# Patient Record
Sex: Male | Born: 1970 | Race: Black or African American | Hispanic: No | Marital: Married | Smoking: Former smoker
Health system: Southern US, Community
[De-identification: ages and names within clinical notes are randomized; demographics above are authoritative.]

## PROBLEM LIST (undated history)

## (undated) ENCOUNTER — Emergency Department (HOSPITAL_COMMUNITY): Discharge status: Against Medical Advice | Payer: Self-pay

## (undated) DIAGNOSIS — I10 Essential (primary) hypertension: Secondary | ICD-10-CM

## (undated) DIAGNOSIS — J349 Unspecified disorder of nose and nasal sinuses: Secondary | ICD-10-CM

## (undated) DIAGNOSIS — E785 Hyperlipidemia, unspecified: Secondary | ICD-10-CM

## (undated) DIAGNOSIS — M1990 Unspecified osteoarthritis, unspecified site: Secondary | ICD-10-CM

## (undated) DIAGNOSIS — E119 Type 2 diabetes mellitus without complications: Secondary | ICD-10-CM

## (undated) HISTORY — DX: Type 2 diabetes mellitus without complications: E11.9

## (undated) HISTORY — DX: Hyperlipidemia, unspecified: E78.5

## (undated) HISTORY — DX: Unspecified osteoarthritis, unspecified site: M19.90

---

## 2000-11-08 ENCOUNTER — Emergency Department (HOSPITAL_COMMUNITY): Admission: EM | Admit: 2000-11-08 | Discharge: 2000-11-08 | Payer: Self-pay | Admitting: *Deleted

## 2004-10-11 ENCOUNTER — Emergency Department (HOSPITAL_COMMUNITY): Admission: EM | Admit: 2004-10-11 | Discharge: 2004-10-11 | Payer: Self-pay | Admitting: Emergency Medicine

## 2005-11-20 ENCOUNTER — Emergency Department (HOSPITAL_COMMUNITY): Admission: EM | Admit: 2005-11-20 | Discharge: 2005-11-20 | Payer: Self-pay | Admitting: Family Medicine

## 2007-01-29 ENCOUNTER — Emergency Department (HOSPITAL_COMMUNITY): Admission: EM | Admit: 2007-01-29 | Discharge: 2007-01-29 | Payer: Self-pay | Admitting: Emergency Medicine

## 2007-02-03 ENCOUNTER — Emergency Department (HOSPITAL_COMMUNITY): Admission: EM | Admit: 2007-02-03 | Discharge: 2007-02-03 | Payer: Self-pay | Admitting: Family Medicine

## 2010-01-14 ENCOUNTER — Emergency Department (HOSPITAL_COMMUNITY): Admission: EM | Admit: 2010-01-14 | Discharge: 2010-01-14 | Payer: Self-pay | Admitting: Emergency Medicine

## 2011-10-14 ENCOUNTER — Emergency Department (HOSPITAL_COMMUNITY)
Admission: EM | Admit: 2011-10-14 | Discharge: 2011-10-14 | Disposition: A | Discharge status: Home / Self Care | Payer: Self-pay | Attending: Emergency Medicine | Admitting: Emergency Medicine

## 2011-10-14 ENCOUNTER — Emergency Department (HOSPITAL_COMMUNITY): Payer: Self-pay

## 2011-10-14 DIAGNOSIS — S0510XA Contusion of eyeball and orbital tissues, unspecified eye, initial encounter: Secondary | ICD-10-CM | POA: Insufficient documentation

## 2011-10-14 DIAGNOSIS — H5789 Other specified disorders of eye and adnexa: Secondary | ICD-10-CM | POA: Insufficient documentation

## 2011-10-14 DIAGNOSIS — S0590XA Unspecified injury of unspecified eye and orbit, initial encounter: Secondary | ICD-10-CM

## 2011-10-14 DIAGNOSIS — I10 Essential (primary) hypertension: Secondary | ICD-10-CM | POA: Insufficient documentation

## 2011-10-14 DIAGNOSIS — H1130 Conjunctival hemorrhage, unspecified eye: Secondary | ICD-10-CM | POA: Insufficient documentation

## 2011-10-14 DIAGNOSIS — IMO0002 Reserved for concepts with insufficient information to code with codable children: Secondary | ICD-10-CM | POA: Insufficient documentation

## 2011-10-14 HISTORY — DX: Essential (primary) hypertension: I10

## 2011-10-14 MED ORDER — FLUORESCEIN SODIUM 1 MG OP STRP
2.0000 | ORAL_STRIP | Freq: Once | OPHTHALMIC | Status: DC
  Filled 2011-10-14: qty 1

## 2011-10-14 MED ORDER — TETRACAINE HCL 0.5 % OP SOLN
2.0000 [drp] | Freq: Once | OPHTHALMIC | Status: AC
  Administered 2011-10-14: 2 [drp] via OPHTHALMIC
  Filled 2011-10-14: qty 2

## 2011-10-14 MED ORDER — FLUORESCEIN SODIUM 1 MG OP STRP
1.0000 | ORAL_STRIP | Freq: Once | OPHTHALMIC | Status: AC
  Administered 2011-10-14: 09:00:00 via OPHTHALMIC

## 2011-10-14 MED ORDER — LOTEPREDNOL-TOBRAMYCIN 0.5-0.3 % OP SUSP: 2.0000 [drp] | Freq: Three times a day (TID) | OPHTHALMIC | Status: DC
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
## 2012-04-25 ENCOUNTER — Encounter (HOSPITAL_COMMUNITY): Payer: Self-pay | Admitting: *Deleted

## 2012-04-25 ENCOUNTER — Emergency Department (HOSPITAL_COMMUNITY)
Admission: EM | Admit: 2012-04-25 | Discharge: 2012-04-25 | Disposition: A | Discharge status: Home / Self Care | Payer: Self-pay | Attending: Emergency Medicine | Admitting: Emergency Medicine

## 2012-04-25 DIAGNOSIS — K0889 Other specified disorders of teeth and supporting structures: Secondary | ICD-10-CM

## 2012-04-25 DIAGNOSIS — K029 Dental caries, unspecified: Secondary | ICD-10-CM | POA: Insufficient documentation

## 2012-04-25 DIAGNOSIS — F17200 Nicotine dependence, unspecified, uncomplicated: Secondary | ICD-10-CM | POA: Insufficient documentation

## 2012-04-25 DIAGNOSIS — K056 Periodontal disease, unspecified: Secondary | ICD-10-CM | POA: Insufficient documentation

## 2012-04-25 MED ORDER — AMOXICILLIN 500 MG PO CAPS
500.0000 mg | ORAL_CAPSULE | Freq: Once | ORAL | Status: AC
  Administered 2012-04-25: 500 mg via ORAL
  Filled 2012-04-25: qty 1

## 2012-04-25 MED ORDER — AMOXICILLIN 500 MG PO CAPS: 500.0000 mg | ORAL_CAPSULE | Freq: Three times a day (TID) | ORAL | Status: AC

## 2012-04-25 MED ORDER — OXYCODONE-ACETAMINOPHEN 5-325 MG PO TABS: 1.0000 | ORAL_TABLET | Freq: Four times a day (QID) | ORAL | Status: AC | PRN

## 2012-04-25 MED ORDER — OXYCODONE-ACETAMINOPHEN 5-325 MG PO TABS
1.0000 | ORAL_TABLET | Freq: Once | ORAL | Status: AC
  Administered 2012-04-25: 1 via ORAL
  Filled 2012-04-25: qty 1

## 2012-04-25 NOTE — ED Provider Notes (Signed)
History     CSN: [id number]  Arrival date & time 04/25/12  1343   First MD Initiated Contact with Patient 04/25/12 1442      Chief Complaint  Patient presents with  . Dental Pain  . Oral Swelling    (Consider location/radiation/quality/duration/timing/severity/associated sxs/prior treatment) HPI Comments: 3 day h/o L mandibular jaw pain, swelling x 3 days. No neck swelling or trouble breathing. No fever. Palpation and chewing makes symptoms worse. No treatments prior. Nothing makes symptoms better. Onset gradual. Course constant.   Patient is a 41 y.o. male presenting with tooth pain. The history is provided by the patient.  Dental PainThe primary symptoms include headaches. Primary symptoms do not include dental injury, fever, shortness of breath or sore throat. The symptoms began 3 to 5 days ago. The symptoms are unchanged. The symptoms are new. The symptoms occur constantly.  Additional symptoms include: gum swelling, gum tenderness, facial swelling and ear pain. Additional symptoms do not include: trismus and trouble swallowing.    Past Medical History  Diagnosis Date  . Hypertension     History reviewed. No pertinent past surgical history.  No family history on file.  History  Substance Use Topics  . Smoking status: Current Everyday Smoker  . Smokeless tobacco: Not on file  . Alcohol Use: No      Review of Systems  Constitutional: Negative for fever.  HENT: Positive for ear pain, facial swelling and dental problem. Negative for sore throat, trouble swallowing and neck pain.   Respiratory: Negative for shortness of breath and stridor.   Skin: Negative for color change.  Neurological: Positive for headaches.    Allergies  Penicillins  Home Medications  No current outpatient prescriptions on file.  BP 168/85  Pulse 92  Temp 99.4 F (37.4 C) (Oral)  Resp 17  SpO2 98%  Physical Exam  Nursing note and vitals reviewed. Constitutional: He appears  well-developed and well-nourished.  HENT:  Head: Normocephalic and atraumatic. No trismus in the jaw.  Right Ear: Tympanic membrane, external ear and ear canal normal.  Left Ear: Tympanic membrane, external ear and ear canal normal.  Nose: Nose normal.  Mouth/Throat: Uvula is midline, oropharynx is clear and moist and mucous membranes are normal. Abnormal dentition. Dental caries present. No dental abscesses or uvula swelling. No tonsillar abscesses.         Mild facial swelling noted L mandibular jaw in area of 2nd premolar. Advanced periodontal disease.   Eyes: Pupils are equal, round, and reactive to light.  Neck: Normal range of motion. Neck supple.       No neck swelling or Lugwig's angina  Neurological: He is alert.  Skin: Skin is warm and dry.  Psychiatric: He has a normal mood and affect.    ED Course  Procedures (including critical care time)  Labs Reviewed - No data to display No results found.   1. Pain, dental     2:50 PM Patient seen and examined. Medications ordered.   Vital signs reviewed and are as follows: Filed Vitals:   04/25/12 1353  BP: 168/85  Pulse: 92  Temp: 99.4 F (37.4 C)  Resp: 17   Patient counseled to take prescribed medications as directed, return with worsening facial or neck swelling, and to follow-up with his dentist as soon as possible.   Patient counseled on use of narcotic pain medications. Counseled not to combine these medications with others containing tylenol. Urged not to drink alcohol, drive, or perform any other activities  that requires focus while taking these medications. The patient verbalizes understanding and agrees with the plan.    MDM  Patient with toothache.  Small forming dental abscess without systemic symptoms.  Exam unconcerning for Ludwig's angina or other deep tissue infection in neck.  Will treat with amoxicillin (penicillin allergy but has had amox in past without problem) and pain medicine.  Urged patient to  follow-up with dentist. Referrals given.          Renne Crigler, [place] 04/25/12 2220

## 2012-04-25 NOTE — ED Notes (Signed)
Pt reports dental pain and swelling x3 days on lower left side. Pt denies broken teeth. Pt reports history of similar issues with teeth on right side of mouth. Pt denies having a dentist. Pt reports pain radiates into jaw and face. Swelling noted.

## 2012-04-25 NOTE — ED Notes (Signed)
Pt denies any rash or difficulty breathing after taking Amoxicillin. Pt with no acute distress.

## 2012-04-25 NOTE — ED Notes (Signed)
PA aware of PCN allergy and states the patient has had amoxicillin before without issue.  PA prescribing amoxicillin to give pt in ER.

## 2012-04-26 NOTE — ED Provider Notes (Signed)
Medical screening examination/treatment/procedure(s) were performed by non-physician practitioner and as supervising physician I was immediately available for consultation/collaboration.   Lyanne Co, MD 04/26/12 2140

## 2013-01-01 ENCOUNTER — Encounter (HOSPITAL_COMMUNITY): Payer: Self-pay | Admitting: Emergency Medicine

## 2013-01-01 ENCOUNTER — Emergency Department (HOSPITAL_COMMUNITY)
Admission: EM | Admit: 2013-01-01 | Discharge: 2013-01-01 | Disposition: A | Discharge status: Home / Self Care | Payer: Self-pay | Attending: Emergency Medicine | Admitting: Emergency Medicine

## 2013-01-01 DIAGNOSIS — IMO0002 Reserved for concepts with insufficient information to code with codable children: Secondary | ICD-10-CM | POA: Insufficient documentation

## 2013-01-01 DIAGNOSIS — F17200 Nicotine dependence, unspecified, uncomplicated: Secondary | ICD-10-CM | POA: Insufficient documentation

## 2013-01-01 DIAGNOSIS — I10 Essential (primary) hypertension: Secondary | ICD-10-CM | POA: Insufficient documentation

## 2013-01-01 DIAGNOSIS — L0291 Cutaneous abscess, unspecified: Secondary | ICD-10-CM

## 2013-01-01 MED ORDER — TRAMADOL HCL 50 MG PO TABS: 50.0000 mg | ORAL_TABLET | Freq: Four times a day (QID) | ORAL | Status: DC | PRN

## 2013-01-01 MED ORDER — TRAMADOL HCL 50 MG PO TABS
50.0000 mg | ORAL_TABLET | Freq: Once | ORAL | Status: AC
  Administered 2013-01-01: 50 mg via ORAL
  Filled 2013-01-01: qty 1

## 2013-01-01 MED ORDER — LORAZEPAM 2 MG/ML IJ SOLN
1.0000 mg | Freq: Once | INTRAMUSCULAR | Status: AC
  Administered 2013-01-01: 1 mg via INTRAMUSCULAR
  Filled 2013-01-01: qty 1

## 2013-01-01 NOTE — ED Notes (Signed)
Onset 2 days ago abscess under right arm and increasing in size overtime. Pain 10/10 sharp throbbing.

## 2013-01-01 NOTE — ED Provider Notes (Signed)
History     CSN: [id number]  Arrival date & time 01/01/13  2130   First MD Initiated Contact with Patient 01/01/13 1054      Chief Complaint  Patient presents with  . Abscess    (Consider location/radiation/quality/duration/timing/severity/associated sxs/prior treatment) Patient is a 42 y.o. male presenting with abscess. The history is provided by the patient. No language interpreter was used.  Abscess Location:  Shoulder/arm Shoulder/arm abscess location:  R axilla Size:  2cm Abscess quality: fluctuance, induration and painful   Abscess quality: not draining, no itching, no redness and not weeping   Red streaking: no   Duration:  2 days Progression:  Worsening Pain details:    Quality:  Sharp and throbbing   Severity:  Severe   Duration:  2 days   Timing:  Constant   Progression:  Worsening Chronicity:  New Context: not diabetes, not immunosuppression and not skin injury   Relieved by:  Nothing Worsened by:  Nothing tried Ineffective treatments:  None tried Associated symptoms: no anorexia, no fatigue, no fever, no headaches, no nausea and no vomiting   Risk factors: prior abscess     Past Medical History  Diagnosis Date  . Hypertension     History reviewed. No pertinent past surgical history.  No family history on file.  History  Substance Use Topics  . Smoking status: Current Every Day Smoker  . Smokeless tobacco: Not on file  . Alcohol Use: No      Review of Systems  Constitutional: Negative for fever and fatigue.  Gastrointestinal: Negative for nausea, vomiting and anorexia.  Neurological: Negative for headaches.  All other systems reviewed and are negative.    Allergies  Penicillins  Home Medications   Current Outpatient Rx  Name  Route  Sig  Dispense  Refill  . traMADol (ULTRAM) 50 MG tablet   Oral   Take 1 tablet (50 mg total) by mouth every 6 (six) hours as needed for pain.   15 tablet   0     BP 153/99  Pulse 88  Temp(Src)  98.4 F (36.9 C) (Oral)  Resp 20  Ht 5\' 8"  (1.727 m)  Wt 185 lb (83.915 kg)  BMI 28.14 kg/m2  SpO2 97%  Physical Exam  Constitutional: He is oriented to person, place, and time. He appears well-developed and well-nourished. No distress.  HENT:  Head: Normocephalic and atraumatic.  Right Ear: External ear normal.  Left Ear: External ear normal.  Nose: Nose normal.  Eyes: Conjunctivae are normal.  Neck: Normal range of motion. No tracheal deviation present.  Cardiovascular: Normal rate, regular rhythm, normal heart sounds and intact distal pulses.  Exam reveals no gallop and no friction rub.   No murmur heard. Pulmonary/Chest: Effort normal and breath sounds normal. No stridor. No respiratory distress. He has no wheezes. He has no rales.  Abdominal: Soft. He exhibits no distension.  Musculoskeletal: Normal range of motion.  Neurological: He is alert and oriented to person, place, and time.  Skin: Skin is warm and dry. He is not diaphoretic.  2 cm abscess on right armpit: fluctuant, indurated  No streaking or erythema  Small abscess that appeared to have drained below 2cm abscess - healing well, no fluctuance or induration remain  Psychiatric: He has a normal mood and affect. His behavior is normal.    ED Course  Procedures (including critical care time)  Labs Reviewed - No data to display No results found.  INCISION AND DRAINAGE Performed by: Konrad Dolores,  Dahlia Client Consent: Verbal consent obtained. Risks and benefits: risks, benefits and alternatives were discussed Type: abscess  Body area: right armpit  Anesthesia: local infiltration  Incision was made with a scalpel.  Local anesthetic: lidocaine 2%   Anesthetic total: 3 ml  Complexity: complex Blunt dissection to break up loculations  Drainage: purulent  Drainage amount: mild  Packing material: 1/4 in iodoform gauze  Patient tolerance: Patient tolerated the procedure well with no immediate  complications.    1. Abscess       MDM  Patient presented today with abscess. Opened and packed to prevent closure of the opening. Follow up with PCP in 2 days. Hemodynamically stable. Return precautions given including return if you develop a fever, notice redness and streaking around the area, or a significant increase in pain. Home care instructions given. Patient / Family / Caregiver informed of clinical course, understand medical decision-making process, and agree with plan.        Mora Bellman, PA-C 01/01/13 1720

## 2013-01-01 NOTE — ED Notes (Signed)
Pt has abcess under right arm that started 2 days ago.  Pain 10/10 Pt alert oriented X4

## 2013-01-01 NOTE — Discharge Instructions (Signed)
Abscess An abscess is an infected area that contains a collection of pus and debris. It can occur in almost any part of the body. An abscess is also known as a furuncle or boil. CAUSES   An abscess occurs when tissue gets infected. This can occur from blockage of oil or sweat glands, infection of hair follicles, or a minor injury to the skin. As the body tries to fight the infection, pus collects in the area and creates pressure under the skin. This pressure causes pain. People with weakened immune systems have difficulty fighting infections and get certain abscesses more often.   SYMPTOMS Usually an abscess develops on the skin and becomes a painful mass that is red, warm, and tender. If the abscess forms under the skin, you may feel a moveable soft area under the skin. Some abscesses break open (rupture) on their own, but most will continue to get worse without care. The infection can spread deeper into the body and eventually into the bloodstream, causing you to feel ill.   DIAGNOSIS   Your caregiver will take your medical history and perform a physical exam. A sample of fluid may also be taken from the abscess to determine what is causing your infection. TREATMENT   Your caregiver may prescribe antibiotic medicines to fight the infection. However, taking antibiotics alone usually does not cure an abscess. Your caregiver may need to make a small cut (incision) in the abscess to drain the pus. In some cases, gauze is packed into the abscess to reduce pain and to continue draining the area. HOME CARE INSTRUCTIONS    Only take over-the-counter or prescription medicines for pain, discomfort, or fever as directed by your caregiver.   If you were prescribed antibiotics, take them as directed. Finish them even if you start to feel better.   If gauze is used, follow your caregiver's directions for changing the gauze.   To avoid spreading the infection:   Keep your draining abscess covered with a  bandage.   Wash your hands well.   Do not share personal care items, towels, or whirlpools with others.   Avoid skin contact with others.   Keep your skin and clothes clean around the abscess.   Keep all follow-up appointments as directed by your caregiver.  SEEK MEDICAL CARE IF:    You have increased pain, swelling, redness, fluid drainage, or bleeding.   You have muscle aches, chills, or a general ill feeling.   You have a fever.  MAKE SURE YOU:    Understand these instructions.   Will watch your condition.   Will get help right away if you are not doing well or get worse.  Document Released: 07/07/2005 Document Revised: 03/28/2012 Document Reviewed: 12/10/2011 [employer] Patient Information 2013 [employer].    

## 2013-01-02 NOTE — ED Provider Notes (Signed)
Medical screening examination/treatment/procedure(s) were performed by non-physician practitioner and as supervising physician I was immediately available for consultation/collaboration.   Johnesha Acheampong E Kit Mollett, MD 01/02/13 1551 

## 2013-12-13 ENCOUNTER — Emergency Department (HOSPITAL_COMMUNITY): Payer: Self-pay

## 2013-12-13 ENCOUNTER — Emergency Department (HOSPITAL_COMMUNITY)
Admission: EM | Admit: 2013-12-13 | Discharge: 2013-12-13 | Disposition: A | Discharge status: Home / Self Care | Payer: Self-pay | Attending: Emergency Medicine | Admitting: Emergency Medicine

## 2013-12-13 ENCOUNTER — Encounter (HOSPITAL_COMMUNITY): Payer: Self-pay | Admitting: Emergency Medicine

## 2013-12-13 DIAGNOSIS — F17200 Nicotine dependence, unspecified, uncomplicated: Secondary | ICD-10-CM | POA: Insufficient documentation

## 2013-12-13 DIAGNOSIS — M545 Low back pain, unspecified: Secondary | ICD-10-CM | POA: Insufficient documentation

## 2013-12-13 DIAGNOSIS — I10 Essential (primary) hypertension: Secondary | ICD-10-CM | POA: Insufficient documentation

## 2013-12-13 DIAGNOSIS — M549 Dorsalgia, unspecified: Secondary | ICD-10-CM

## 2013-12-13 DIAGNOSIS — G8921 Chronic pain due to trauma: Secondary | ICD-10-CM | POA: Insufficient documentation

## 2013-12-13 DIAGNOSIS — Z880 Allergy status to penicillin: Secondary | ICD-10-CM | POA: Insufficient documentation

## 2013-12-13 MED ORDER — HYDROCODONE-ACETAMINOPHEN 5-325 MG PO TABS: 2.0000 | ORAL_TABLET | Freq: Four times a day (QID) | ORAL | Status: DC | PRN

## 2013-12-13 MED ORDER — KETOROLAC TROMETHAMINE 60 MG/2ML IM SOLN
60.0000 mg | Freq: Once | INTRAMUSCULAR | Status: DC
  Filled 2013-12-13: qty 2

## 2013-12-13 NOTE — ED Notes (Signed)
Per pt, spine pain radiating to left.  No difficulty urinating.  No trauma or injury.  No new exercise or activity.
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
## 2013-12-13 NOTE — Discharge Instructions (Signed)
Back Pain, Adult Low back pain is very common. About 1 in 5 people have back pain.The cause of low back pain is rarely dangerous. The pain often gets better over time.About half of people with a sudden onset of back pain feel better in just 2 weeks. About 8 in 10 people feel better by 6 weeks.  CAUSES Some common causes of back pain include:  Strain of the muscles or ligaments supporting the spine.  Wear and tear (degeneration) of the spinal discs.  Arthritis.  Direct injury to the back. DIAGNOSIS Most of the time, the direct cause of low back pain is not known.However, back pain can be treated effectively even when the exact cause of the pain is unknown.Answering your caregiver's questions about your overall health and symptoms is one of the most accurate ways to make sure the cause of your pain is not dangerous. If your caregiver needs more information, he or she may order lab work or imaging tests (X-rays or MRIs).However, even if imaging tests show changes in your back, this usually does not require surgery. HOME CARE INSTRUCTIONS For many people, back pain returns.Since low back pain is rarely dangerous, it is often a condition that people can learn to [facility] their own.   Remain active. It is stressful on the back to sit or stand in one place. Do not sit, drive, or stand in one place for more than 30 minutes at a time. Take short walks on level surfaces as soon as pain allows.Try to increase the length of time you walk each day.  Do not stay in bed.Resting more than 1 or 2 days can delay your recovery.  Do not avoid exercise or work.Your body is made to move.It is not dangerous to be active, even though your back may hurt.Your back will likely heal faster if you return to being active before your pain is gone.  Pay attention to your body when you bend and lift. Many people have less discomfortwhen lifting if they bend their knees, keep the load close to their bodies,and  avoid twisting. Often, the most comfortable positions are those that put less stress on your recovering back.  Find a comfortable position to sleep. Use a firm mattress and lie on your side with your knees slightly bent. If you lie on your back, put a pillow under your knees.  Only take over-the-counter or prescription medicines as directed by your caregiver. Over-the-counter medicines to reduce pain and inflammation are often the most helpful.Your caregiver may prescribe muscle relaxant drugs.These medicines help dull your pain so you can more quickly return to your normal activities and healthy exercise.  Put ice on the injured area.  Put ice in a plastic bag.  Place a towel between your skin and the bag.  Leave the ice on for 15-20 minutes, 03-04 times a day for the first 2 to 3 days. After that, ice and heat may be alternated to reduce pain and spasms.  Ask your caregiver about trying back exercises and gentle massage. This may be of some benefit.  Avoid feeling anxious or stressed.Stress increases muscle tension and can worsen back pain.It is important to recognize when you are anxious or stressed and learn ways to manage it.Exercise is a great option. SEEK MEDICAL CARE IF:  You have pain that is not relieved with rest or medicine.  You have pain that does not improve in 1 week.  You have new symptoms.  You are generally not feeling well. SEEK  IMMEDIATE MEDICAL CARE IF:   You have pain that radiates from your back into your legs.  You develop new bowel or bladder control problems.  You have unusual weakness or numbness in your arms or legs.  You develop nausea or vomiting.  You develop abdominal pain.  You feel faint. Document Released: 09/27/2005 Document Revised: 03/28/2012 Document Reviewed: 02/15/2011 [facility] Patient Information 2014 [place], [place].   Hydrocodone for mod-severe pain Establish and follow-up with PCP

## 2013-12-13 NOTE — Progress Notes (Signed)
[facility] provided pt with a list of primary care resources and a [facility] Orange Card application to help patient establish primary care.  °

## 2013-12-20 NOTE — ED Provider Notes (Signed)
Medical screening examination/treatment/procedure(s) were performed by non-physician practitioner and as supervising physician I was immediately available for consultation/collaboration.   EKG Interpretation None      Rolland Porter, MD, Abram Sander   Janice Norrie, MD 12/20/13 [phone]

## 2014-01-30 ENCOUNTER — Ambulatory Visit: Payer: Self-pay

## 2014-06-11 ENCOUNTER — Ambulatory Visit: Payer: Self-pay | Admitting: Internal Medicine

## 2014-06-27 ENCOUNTER — Ambulatory Visit: Payer: Self-pay | Admitting: Family Medicine

## 2014-07-21 ENCOUNTER — Emergency Department (HOSPITAL_COMMUNITY)
Admission: EM | Admit: 2014-07-21 | Discharge: 2014-07-22 | Disposition: A | Discharge status: Home / Self Care | Payer: Self-pay | Attending: Emergency Medicine | Admitting: Emergency Medicine

## 2014-07-21 DIAGNOSIS — I10 Essential (primary) hypertension: Secondary | ICD-10-CM | POA: Insufficient documentation

## 2014-07-21 DIAGNOSIS — M545 Low back pain, unspecified: Secondary | ICD-10-CM

## 2014-07-21 DIAGNOSIS — Y92410 Unspecified street and highway as the place of occurrence of the external cause: Secondary | ICD-10-CM | POA: Insufficient documentation

## 2014-07-21 DIAGNOSIS — Y9389 Activity, other specified: Secondary | ICD-10-CM | POA: Insufficient documentation

## 2014-07-21 DIAGNOSIS — Z8709 Personal history of other diseases of the respiratory system: Secondary | ICD-10-CM | POA: Insufficient documentation

## 2014-07-21 DIAGNOSIS — S161XXA Strain of muscle, fascia and tendon at neck level, initial encounter: Secondary | ICD-10-CM

## 2014-07-21 DIAGNOSIS — S139XXA Sprain of joints and ligaments of unspecified parts of neck, initial encounter: Secondary | ICD-10-CM | POA: Insufficient documentation

## 2014-07-21 DIAGNOSIS — S3982XA Other specified injuries of lower back, initial encounter: Secondary | ICD-10-CM | POA: Insufficient documentation

## 2014-07-21 DIAGNOSIS — Z880 Allergy status to penicillin: Secondary | ICD-10-CM | POA: Insufficient documentation

## 2014-07-21 HISTORY — DX: Unspecified disorder of nose and nasal sinuses: J34.9
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
## 2014-07-22 ENCOUNTER — Encounter (HOSPITAL_COMMUNITY): Payer: Self-pay | Admitting: Emergency Medicine

## 2014-07-22 ENCOUNTER — Emergency Department (HOSPITAL_COMMUNITY): Payer: Self-pay

## 2014-07-22 MED ORDER — NAPROXEN 500 MG PO TABS: 500.0000 mg | ORAL_TABLET | Freq: Two times a day (BID) | ORAL | Status: DC

## 2014-07-22 MED ORDER — NAPROXEN 500 MG PO TABS
500.0000 mg | ORAL_TABLET | Freq: Once | ORAL | Status: AC
Start: 2014-07-22 — End: 2014-07-22
  Administered 2014-07-22: 500 mg via ORAL
  Filled 2014-07-22: qty 1

## 2014-07-22 MED ORDER — DIAZEPAM 5 MG PO TABS
5.0000 mg | ORAL_TABLET | Freq: Once | ORAL | Status: AC
  Administered 2014-07-22: 5 mg via ORAL
  Filled 2014-07-22: qty 1

## 2014-07-22 MED ORDER — DIAZEPAM 5 MG PO TABS: 5.0000 mg | ORAL_TABLET | Freq: Two times a day (BID) | ORAL | Status: DC

## 2014-07-22 MED ORDER — OXYCODONE-ACETAMINOPHEN 5-325 MG PO TABS
2.0000 | ORAL_TABLET | Freq: Once | ORAL | Status: AC
  Administered 2014-07-22: 2 via ORAL
  Filled 2014-07-22: qty 2

## 2014-07-22 MED ORDER — HYDROCODONE-ACETAMINOPHEN 5-325 MG PO TABS: 1.0000 | ORAL_TABLET | Freq: Four times a day (QID) | ORAL | Status: DC | PRN
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
## 2014-07-22 NOTE — ED Provider Notes (Signed)
Medical screening examination/treatment/procedure(s) were performed by non-physician practitioner and as supervising physician I was immediately available for consultation/collaboration.   Leota Jacobsen, MD 07/22/14 [phone]

## 2014-10-25 ENCOUNTER — Institutional Professional Consult (permissible substitution): Payer: Self-pay | Admitting: Sports Medicine

## 2014-10-30 ENCOUNTER — Ambulatory Visit (INDEPENDENT_AMBULATORY_CARE_PROVIDER_SITE_OTHER): Payer: Self-pay

## 2014-10-30 ENCOUNTER — Encounter: Payer: Self-pay | Admitting: Sports Medicine

## 2014-10-30 ENCOUNTER — Ambulatory Visit (INDEPENDENT_AMBULATORY_CARE_PROVIDER_SITE_OTHER): Payer: Self-pay | Admitting: Sports Medicine

## 2014-10-30 ENCOUNTER — Telehealth: Payer: Self-pay

## 2014-10-30 VITALS — BP 172/107 | HR 93 | Ht 68.0 in | Wt 219.0 lb

## 2014-10-30 DIAGNOSIS — M545 Low back pain, unspecified: Secondary | ICD-10-CM

## 2014-10-30 DIAGNOSIS — M5136 Other intervertebral disc degeneration, lumbar region: Secondary | ICD-10-CM | POA: Insufficient documentation

## 2014-10-30 DIAGNOSIS — M51369 Other intervertebral disc degeneration, lumbar region without mention of lumbar back pain or lower extremity pain: Secondary | ICD-10-CM | POA: Insufficient documentation

## 2014-10-30 MED ORDER — PREDNISONE 50 MG PO TABS: ORAL_TABLET | ORAL | Status: DC

## 2014-10-30 MED ORDER — MELOXICAM 15 MG PO TABS: ORAL_TABLET | ORAL | Status: DC

## 2014-10-30 MED ORDER — CYCLOBENZAPRINE HCL 10 MG PO TABS: ORAL_TABLET | ORAL | Status: DC

## 2014-10-30 NOTE — Telephone Encounter (Signed)
Patient was seen in office today for back pain. Patient did request a note to be out of work until November 05, 2014. Imaan Padgett,CMA

## 2014-10-30 NOTE — Assessment & Plan Note (Signed)
Pain is likely related to lumbar spondylosis. He does not have radiculopathy however pain is discogenic. Prednisone, meloxicam, Flexeril, x-rays, formal physical therapy.  Return in one month, MRI for interventional if no better.
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
## 2014-11-07 ENCOUNTER — Ambulatory Visit: Payer: No Typology Code available for payment source | Attending: Sports Medicine | Admitting: Physical Therapy

## 2014-11-07 DIAGNOSIS — M545 Low back pain, unspecified: Secondary | ICD-10-CM

## 2014-11-07 NOTE — Patient Instructions (Addendum)
Trigger Point Dry Needling  . What is Trigger Point Dry Needling (DN)? o DN is a physical therapy technique used to treat muscle pain and dysfunction. Specifically, DN helps deactivate muscle trigger points (muscle knots).  o A thin filiform needle is used to penetrate the skin and stimulate the underlying trigger point. The goal is for a local twitch response (LTR) to occur and for the trigger point to relax. No medication of any kind is injected during the procedure.   . What Does Trigger Point Dry Needling Feel Like?  o The procedure feels different for each individual patient. Some patients report that they do not actually feel the needle enter the skin and overall the process is not painful. Very mild bleeding may occur. However, many patients feel a deep cramping in the muscle in which the needle was inserted. This is the local twitch response.   Marland Kitchen How Will I feel after the treatment? o Soreness is normal, and the onset of soreness may not occur for a few hours. Typically this soreness does not last longer than two days.  o Bruising is uncommon, however; ice can be used to decrease any possible bruising.  o In rare cases feeling tired or nauseous after the treatment is normal. In addition, your symptoms may get worse before they get better, this period will typically not last longer than 24 hours.   . What Can I do After My Treatment? o Increase your hydration by drinking more water for the next 24 hours. o You may place ice or heat on the areas treated that have become sore, however, do not use heat on inflamed or bruised areas. Heat often brings more relief post needling. o You can continue your regular activities, but vigorous activity is not recommended initially after the treatment for 24 hours. o DN is best combined with other physical therapy such as strengthening, stretching, and other therapies.  Backward Bend (Standing)   Arch backward to make hollow of back deeper. Hold __2__  seconds. Repeat __10_ times per set. Do ___1_ sets per session. Every 2 hours.  http://orth.exer.us/178   Copyright  VHI. All rights reserved.  Press-Up   Press upper body upward, keeping hips in contact with floor. Keep lower back and buttocks relaxed. Hold __2__ seconds. Repeat __10__ times per set. Do _1__ sets per session. Every 2 hours.  http://orth.exer.us/94   Copyright  VHI. All rights reserved.
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
## 2014-11-12 ENCOUNTER — Ambulatory Visit: Payer: Self-pay | Attending: Sports Medicine | Admitting: Physical Therapy

## 2014-11-12 DIAGNOSIS — M545 Low back pain, unspecified: Secondary | ICD-10-CM

## 2014-11-12 NOTE — Therapy (Signed)
[name], [place], [address] Phone: [phone]   Fax:  [phone]  Physical Therapy Treatment  Patient Details  Name: Juan Hensley MRN: [id number] Date of Birth: 04-05-1971 Referring Provider:  Silverio Decamp,*  Encounter Date: 11/12/2014      PT End of Session - 11/12/14 1440    Visit Number 2   Number of Visits 16   Date for PT Re-Evaluation 01/02/15   PT Start Time 0845   PT Stop Time 0936   PT Time Calculation (min) 51 min   Activity Tolerance Patient tolerated treatment well      Past Medical History  Diagnosis Date  . Hypertension   . Sinus disease     No past surgical history on file.  There were no vitals taken for this visit.  Visit Diagnosis:  Left-sided low back pain without sciatica      Subjective Assessment - 11/12/14 0846    Symptoms Reports he has been doing press ups and can get up a little higher each time he does them.     Pain Score 7    Pain Location Back   Pain Orientation Left   Pain Type Chronic pain                    [facility] Adult PT Treatment/Exercise - 11/12/14 0925    Exercises   Exercises Lumbar   Lumbar Exercises: Supine   Ab Set 10 reps   Isometric Hip Flexion 10 reps   Lumbar Exercises: Prone   Other Prone Lumbar Exercises --  Prone press ups 10x   Other Prone Lumbar Exercises --  Prone press ups with exhalation 5x   Modalities   Modalities Moist Heat   Moist Heat Therapy   Number Minutes Moist Heat 12 Minutes   Moist Heat Location --  Lumbar   Manual Therapy   Manual Therapy Joint mobilization;Myofascial release   Joint Mobilization --  left neutral gapping grade 3 30 sec; left hip distract/inf   Myofascial Release --  eft lumbar multifid instrument assisted ischemic pressure          Trigger Point Dry Needling - 11/12/14 1435    Consent Given? 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   Time 4   Period Weeks   Status On-going           PT Long Term Goals - 11/12/14 1445    PT LONG TERM GOAL #1   Title "Demonstrate and verbalize techniques to reduce the risk of re-injury including: lifting, posture, body mechanics.    Time 8  Period Weeks   Status On-going   PT LONG TERM GOAL #2   Title "Pt will be independent with advanced HEP and appropriate gym program (patient plans on joining [employer])   Time 8   Period Weeks   Status On-going   PT LONG TERM GOAL #3   Title "FOTO will improve from  63%  to  37%   indicating improved functional mobility with less perceived pain   Time 8   Period Weeks   Status On-going   PT LONG TERM GOAL #4   Title Lumbar flexion improved to 40 degrees and left sidebending to 33 degrees needed for work as a Dealer   Time 8   Period Weeks   Status On-going   PT LONG TERM GOAL #5   Title Trunk flexor and extensor strength to 4+/5 needed for strength/stabilization with work duties and playing with the grandchildren.   Time 8   Period Weeks   Status On-going               Plan - 11/12/14 1441    Clinical Impression Statement The patient with improved muscle length and extensibility following ther ex and manual interventions.  No progress toward goals secondary to only 2 nd visit.  Therapist  closely monitoring pain intensity and location throughout treatment session.    PT Next Visit Plan Assess response to dry needling of left lumbar multifidi and lumbar/hip mobilization; progress core strengthening abdominal brace and possibly initiate quadruped bird dogs        Problem List Patient Active Problem List   Diagnosis Date Noted  . Low back pain 10/30/2014    Alvera Singh 11/12/2014, 2:47 PM  [facility] [address] [place], [place], [address] Phone: [phone]   Fax:  [phone]    Ruben Im, PT 11/12/2014 2:47 PM Phone: [phone] Fax: [phone]

## 2014-11-15 ENCOUNTER — Ambulatory Visit: Payer: Self-pay | Admitting: Physical Therapy

## 2014-11-15 DIAGNOSIS — M545 Low back pain, unspecified: Secondary | ICD-10-CM

## 2014-11-15 NOTE — Patient Instructions (Signed)
Isometric Hold (Quadruped)   On hands and knees, slowly inhale, and then exhale. Pull navel toward spine and Hold for _3_ seconds. Continue to breathe in and out during hold. Rest for ___ seconds. Repeat _10__ times. Do __1_ times a day.   Copyright  VHI. All rights reserved.  Bracing With Arm Raise (Quadruped)   On hands and knees find neutral spine. Tighten pelvic floor and abdominals and hold. Alternately lift arm to shoulder level. Repeat _10__ times. Do __1_ times a day.   Copyright  VHI. All rights reserved.  Bracing With Leg Raise (Quadruped)   On hands and knees find neutral spine. Tighten pelvic floor and abdominals and hold. Alternating legs, straighten and lift to hip level. Repeat ___ times. Do ___ times a day.   Copyright  VHI. All rights reserved.  Bracing With Arm / Leg Raise (Quadruped)   On hands and knees find neutral spine. Tighten pelvic floor and abdominals and hold. Alternating, lift arm to shoulder level and opposite leg to hip level. Repeat _10__ times. Do ___ times a day.   Copyright  VHI. All rights reserved.

## 2014-11-15 NOTE — Therapy (Signed)
[place], [place], [address] Phone: [phone]   Fax:  [phone]  Physical Therapy Treatment  Patient Details  Name: Juan Hensley MRN: [id number] Date of Birth: Jul 24, 1971 Referring Provider:  Silverio Decamp,*  Encounter Date: 11/15/2014      PT End of Session - 11/15/14 0900    Visit Number 3   Number of Visits 16   Date for PT Re-Evaluation 01/02/15   PT Start Time 0800   PT Stop Time 0900   PT Time Calculation (min) 60 min   Activity Tolerance Patient tolerated treatment well      Past Medical History  Diagnosis Date  . Hypertension   . Sinus disease     No past surgical history on file.  There were no vitals taken for this visit.  Visit Diagnosis:  Left-sided low back pain without sciatica      Subjective Assessment - 11/15/14 0757    Symptoms I can walk further now.  Some soreness and achiness.     How long can you walk comfortably? 2 blocks (better)   Currently in Pain? Yes   Pain Score 6    Pain Location Back   Pain Orientation Left   Pain Type Chronic pain   Pain Frequency Constant                    [facility] Adult PT Treatment/Exercise - 11/15/14 0849    Lumbar Exercises: Supine   Ab Set 10 reps   Dead Bug 10 reps   Isometric Hip Flexion 10 reps   Lumbar Exercises: Prone   Other Prone Lumbar Exercises --  Prone press ups 10x,with exhale, with belt 5x   Lumbar Exercises: Quadruped   Opposite Arm/Leg Raise 10 reps   Moist Heat Therapy   Number Minutes Moist Heat 12 Minutes   Moist Heat Location --  Lumbar supine   Manual Therapy   Joint Mobilization PA L1 to L5 grade 3 5x each level   Myofascial Release Soft tissue to left lumbar paraspinal          Trigger Point Dry Needling - 11/15/14 7341    Consent Given? 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   Time 4   Period Weeks   Status On-going           PT Long Term Goals - 11/15/14 [phone]  PT LONG TERM GOAL #1   Title "Demonstrate and verbalize techniques to reduce the risk of re-injury including: lifting, posture, body mechanics.    Time 8   Period Weeks   Status On-going   PT LONG TERM GOAL #2   Title "Pt will be independent with advanced HEP and appropriate gym program (patient plans on joining [employer])   Time 8   Period Weeks   Status On-going   PT LONG TERM GOAL #3   Title "FOTO will improve from  63%  to  37%   indicating improved functional mobility with less perceived pain   Time 8   Period Weeks   Status On-going   PT LONG TERM GOAL #4   Title Lumbar flexion improved to 40 degrees and left sidebending to 33 degrees needed for work as a Dealer   Time 8   Period Weeks   Status On-going   PT LONG TERM GOAL #5   Title Trunk flexor and extensor strength to 4+/5 needed for strength/stabilization with work duties and playing with the grandchildren.   Time 8   Period Weeks   Status On-going               Plan - 11/15/14 0900    Clinical Impression Statement Lumbar joint mobility  improving, improved walking ability and slight reduction in overall pain.  Patient does continue to have deep left lumbar multifidi pain and spasm.  Patient receptive to PT education and willing to pursue new interventions since previous chiro treatment had no benefit.  Progressing toward STGs.    PT Next Visit Plan Recheck lumbar AROM; Review and progress abdominal bracing series and quadruped bird dogs; prone press ups with overpressure;  assess response to dry needling; Possible left lumbar mobs and hip mobs        Problem List Patient Active Problem List   Diagnosis Date Noted  . Low back pain 10/30/2014    Alvera Singh 11/15/2014, 9:09 AM  [facility] [address] [place], [place], [address] Phone: [phone]   Fax:  [phone]    Ruben Im, PT 11/15/2014 9:09 AM Phone: [phone] Fax: [phone]

## 2014-11-19 ENCOUNTER — Ambulatory Visit: Payer: Self-pay | Admitting: Rehabilitation

## 2014-11-19 DIAGNOSIS — M545 Low back pain, unspecified: Secondary | ICD-10-CM

## 2014-11-19 NOTE — Therapy (Signed)
[place], [place], [address] Phone: [phone]   Fax:  [phone]  Physical Therapy Treatment  Patient Details  Name: Juan Hensley MRN: [id number] Date of Birth: 08-26-1971 Referring Provider:  Silverio Decamp,*  Encounter Date: 11/19/2014      PT End of Session - 11/19/14 0807    Visit Number 4   Number of Visits 16   Date for PT Re-Evaluation 01/02/15   PT Start Time 0805      Past Medical History  Diagnosis Date  . Hypertension   . Sinus disease     No past surgical history on file.  There were no vitals taken for this visit.  Visit Diagnosis:  Left-sided low back pain without sciatica      Subjective Assessment - 11/19/14 0806    Symptoms Left low back achy pain. 6/10   Aggravating Factors  walking   Pain Relieving Factors sitting, resting.           [facility] PT Assessment - 11/19/14 0810    AROM   Lumbar Flexion 48   Lumbar Extension 22   Lumbar - Right Side Bend 36   Lumbar - Left Side Bend 36                  [facility] Adult PT Treatment/Exercise - 11/19/14 0813    Lumbar Exercises: Stretches   Single Knee to Chest Stretch 3 reps;20 seconds   Press Ups 5 reps  and 10 reps with over pressure.    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   Time 4   Period Weeks   Status On-going           PT Long Term Goals - 11/15/14 0907    PT LONG TERM GOAL #1   Title "Demonstrate and verbalize techniques to reduce the risk of re-injury including: lifting, posture, body mechanics.    Time 8   Period Weeks   Status On-going   PT LONG TERM GOAL #2   Title "Pt will be independent with advanced HEP and appropriate gym program (patient plans on joining [employer])   Time 8   Period Weeks   Status On-going   PT LONG TERM GOAL #3   Title "FOTO will improve from  63%  to  37%   indicating improved functional mobility with less  perceived pain   Time 8   Period Weeks   Status On-going   PT LONG TERM GOAL #4   Title Lumbar flexion improved to 40 degrees and left sidebending to 33 degrees needed for work as a Dealer   Time 8   Period Weeks   Status On-going   PT LONG TERM GOAL #5   Title Trunk flexor and extensor strength to 4+/5 needed for strength/stabilization with work duties and playing with the grandchildren.   Time 8   Period Weeks   Status On-going               Plan - 11/19/14 5947    Clinical Impression Statement All STGs Met. Pt reports improved awareness at work of flexed positions and is able to stop and relax as needed and perform exercises at work to decrease onset of pain. His lumbar AROM has improved since eval and he reports significant improvement in pain.  He reports pain  reduced to 5/10 after therex and prior to moist heat today.   PT Next Visit Plan Review and progress abdominal bracing series and quadruped bird dogs; prone press ups with overpressure;  assess response to dry needling; Possible left lumbar mobs and hip mobs        Problem List Patient Active Problem List   Diagnosis Date  Noted  . Low back pain 10/30/2014    Dorene Ar, [place] 11/19/2014, 8:42 AM  [facility] [address] [place], [place], [address] Phone: [phone]   Fax:  [phone]

## 2014-11-22 ENCOUNTER — Ambulatory Visit: Payer: Self-pay | Admitting: Physical Therapy

## 2014-11-22 DIAGNOSIS — M545 Low back pain, unspecified: Secondary | ICD-10-CM

## 2014-11-22 NOTE — Therapy (Signed)
[place], [place], [address] Phone: [phone]   Fax:  [phone]  Physical Therapy Treatment  Patient Details  Name: Juan Hensley MRN: [id number] Date of Birth: Mar 17, 1971 Referring Provider:  Silverio Decamp,*  Encounter Date: 11/22/2014      PT End of Session - 11/22/14 1231    Visit Number 5   Number of Visits 16   Date for PT Re-Evaluation 01/02/15   PT Start Time 0800   PT Stop Time 0900   PT Time Calculation (min) 60 min   Activity Tolerance Patient tolerated treatment well      Past Medical History  Diagnosis Date  . Hypertension   . Sinus disease     No past surgical history on file.  There were no vitals taken for this visit.  Visit Diagnosis:  Left-sided low back pain without sciatica      Subjective Assessment - 11/22/14 0803    Symptoms Had a bad day yesterday and didn't go to work.  Left LBP.  Sore 24 hours after needling but then felt better.  I can walk further now for about 2 blocks before it tightens up.   Currently in Pain? Yes   Pain Score 5    Pain Location Back   Pain Orientation Left   Pain Type Chronic pain   Aggravating Factors  walking > 2 blocks, leaning foward periods of time                    [facility] Adult PT Treatment/Exercise - 11/22/14 1220    Lumbar Exercises: Sidelying   Other Sidelying Lumbar Exercises modified side planks knees down R/L 5x5 sec holds   Lumbar Exercises: Prone   Other Prone Lumbar Exercises modified plank on knees 5x5 sec holds   Other Prone Lumbar Exercises prone press ups 15x   Lumbar Exercises: Quadruped   Opposite Arm/Leg Raise 10 reps   Moist Heat Therapy   Number Minutes Moist Heat 12 Minutes   Moist Heat Location --  lumbar   Manual Therapy   Joint Mobilization lumbar neutral gapping 3x 15 sec; hip inferior mob and distraction grade 4 30 sec each; traction with belt    Myofascial Release lumbar  multifidi left          Trigger Point Dry Needling - 11/22/14 1228    Consent Given? 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   Status Achieved           PT Long Term Goals - 11/22/14 1236    PT LONG TERM GOAL #1   Title "Demonstrate and verbalize techniques to reduce the risk of re-injury including: lifting, posture, body mechanics.    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   Time 8   Period Weeks   Status On-going               Plan - 11/22/14 1232    Clinical Impression Statement Patient with continued spasm and tightness in left lumbar multifdi although lumbar AROM with marked improvements.  Improving with core  strengthening/stability with a progression of exercise difficulty.   PT Next Visit Plan Progress abdominal bracing series and extensor strengthening (modified planks) ; prone press ups with overpressure;  assess response to dry needling        Problem List Patient Active Problem List   Diagnosis Date Noted  . Low back pain 10/30/2014    Alvera Singh 11/22/2014, 12:38 PM  [facility] [address] [place], [place], [address] Phone: [phone]   Fax:  [phone]   Ruben Im, PT 11/22/2014 12:38 PM Phone: [phone] Fax: [phone]

## 2014-11-25 ENCOUNTER — Ambulatory Visit: Payer: Self-pay | Admitting: Rehabilitation

## 2014-11-27 ENCOUNTER — Ambulatory Visit (INDEPENDENT_AMBULATORY_CARE_PROVIDER_SITE_OTHER): Payer: Self-pay | Admitting: Sports Medicine

## 2014-11-27 ENCOUNTER — Encounter: Payer: Self-pay | Admitting: Sports Medicine

## 2014-11-27 DIAGNOSIS — M51369 Other intervertebral disc degeneration, lumbar region without mention of lumbar back pain or lower extremity pain: Secondary | ICD-10-CM

## 2014-11-27 DIAGNOSIS — M5136 Other intervertebral disc degeneration, lumbar region: Secondary | ICD-10-CM

## 2014-11-27 MED ORDER — AMITRIPTYLINE HCL 25 MG PO TABS: 25.0000 mg | ORAL_TABLET | Freq: Every day | ORAL | Status: DC

## 2014-11-27 NOTE — Progress Notes (Signed)
  Subjective:    CC: Follow-up  HPI: This pleasant 44 year old male returns for follow-up of lumbar degenerative disc disease, at the previous visit he was having some left-sided radicular symptoms which have improved significantly with prednisone, and formal physical therapy. Unfortunately he continues to have axial and discogenic pain that's approximately 40% better from the prior visit. At this point he is amenable to proceeding to the next step. Continues to deny any fevers or chills, no bowel or bladder dysfunction. No saddle numbness.  Past medical history, Surgical history, Family history not pertinant except as noted below, Social history, Allergies, and medications have been entered into the medical record, reviewed, and no changes needed.   Review of Systems: No fevers, chills, night sweats, weight loss, chest pain, or shortness of breath.   Objective:    General: Well Developed, well nourished, and in no acute distress.  Neuro: Alert and oriented x3, extra-ocular muscles intact, sensation grossly intact.  HEENT: Normocephalic, atraumatic, pupils equal round reactive to light, neck supple, no masses, no lymphadenopathy, thyroid nonpalpable.  Skin: Warm and dry, no rashes. Cardiac: Regular rate and rhythm, no murmurs rubs or gallops, no lower extremity edema.  Respiratory: Clear to auscultation bilaterally. Not using accessory muscles, speaking in full sentences. Back Exam:  Inspection: Unremarkable  Motion: Flexion 45 deg, Extension 45 deg, Side Bending to 45 deg bilaterally,  Rotation to 45 deg bilaterally  SLR laying: Negative  XSLR laying: Negative  Palpable tenderness: None. FABER: negative. Sensory change: Gross sensation intact to all lumbar and sacral dermatomes.  Reflexes: 2+ at both patellar tendons, 2+ at achilles tendons, Babinski's downgoing.  Strength at foot  Plantar-flexion: 5/5 Dorsi-flexion: 5/5 Eversion: 5/5 Inversion: 5/5  Leg strength  Quad: 5/5  Hamstring: 5/5 Hip flexor: 5/5 Hip abductors: 5/5  Gait unremarkable.  Again reviewed the x-rays which showed widespread degenerative changes worst at the L3-L4 level. Facet disease is minimal.  Impression and Recommendations:

## 2014-11-27 NOTE — Assessment & Plan Note (Signed)
40% better with physical therapy.  At this point we are going to proceed with an MRI, in anticipation of an epidural. He has failed greater than 6 weeks of physician directed rehabilitation. I'm also going to switch him from Flexeril to an amitriptyline up taper.

## 2014-11-29 ENCOUNTER — Telehealth: Payer: Self-pay

## 2014-11-29 ENCOUNTER — Encounter: Payer: Self-pay | Admitting: Physical Therapy
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
## 2014-12-03 ENCOUNTER — Ambulatory Visit: Payer: Self-pay | Admitting: Physical Therapy

## 2014-12-03 DIAGNOSIS — M545 Low back pain, unspecified: Secondary | ICD-10-CM

## 2014-12-03 NOTE — Therapy (Signed)
[place], [place], [address] Phone: [phone]   Fax:  [phone]  Physical Therapy Treatment  Patient Details  Name: Juan Hensley MRN: [id number] Date of Birth: May 29, 1971 Referring Provider:  Silverio Decamp,*  Encounter Date: 12/03/2014      PT End of Session - 12/03/14 0938    Visit Number 6   Number of Visits 16   Date for PT Re-Evaluation 01/02/15   PT Start Time 0850   PT Stop Time 0939   PT Time Calculation (min) 49 min   Activity Tolerance Patient tolerated treatment well;Patient limited by pain      Past Medical History  Diagnosis Date  . Hypertension   . Sinus disease     No past surgical history on file.  There were no vitals taken for this visit.  Visit Diagnosis:  Left-sided low back pain without sciatica      Subjective Assessment - 12/03/14 0850    Symptoms They are going to schedule an MRI but the doctor wants me to keep doing PT.  Patient states he thinks the dry needling has helped.  Sore for 24 hours.     How long can you walk comfortably? 2 blocks (better)   Currently in Pain? Yes   Pain Score 4    Pain Location Back   Pain Orientation Left   Aggravating Factors  standing;  good days and bad days on the walking   Pain Relieving Factors stretching in all directions                    [facility] Adult PT Treatment/Exercise - 12/03/14 0858    Lumbar Exercises: Supine   Ab Set 10 reps   Bent Knee Raise 10 reps   Dead Bug 10 reps   Isometric Hip Flexion 10 reps   Lumbar Exercises: Sidelying   Other Sidelying Lumbar Exercises modified side planks knees down R/L 5x5 sec holds   Lumbar Exercises: Prone   Other Prone Lumbar Exercises modified plank on knees 5x5 sec holds   Other Prone Lumbar Exercises prone press ups 15x   Moist Heat Therapy   Number Minutes Moist Heat 12 Minutes   Moist Heat Location --  lumbar supine   Manual Therapy   Joint  Mobilization lumbar neutral gapping,left hip inferior mob, long axis distract, traction with belt grade 4 5x 30 sec   Myofascial Release --  left paraspinal soft tissue mobilization          Trigger Point Dry Needling - 12/03/14 0935    Consent Given? 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   Status Achieved           PT Long Term Goals - 12/03/14 6283    PT  LONG TERM GOAL #1   Title "Demonstrate and verbalize techniques to reduce the risk of re-injury including: lifting, posture, body mechanics.    Time 8   Period Weeks   Status On-going   PT LONG TERM GOAL #2   Title "Pt will be independent with advanced HEP and appropriate gym program (patient plans on joining [employer])   Time 8   Period Weeks   Status On-going   PT LONG TERM GOAL #3   Title "FOTO will improve from  63%  to  37%   indicating improved functional mobility with less perceived pain   Time 8   Period Weeks   Status On-going   PT LONG TERM GOAL #4   Title Lumbar flexion improved to 40 degrees and left sidebending to 33 degrees needed for work as a Dealer   Time 8   Period Weeks   Status On-going   PT LONG TERM GOAL #5   Title Trunk flexor and extensor strength to 4+/5 needed for strength/stabilization with work duties and playing with the grandchildren.   Time 8   Period Weeks   Status On-going               Plan - 12/03/14 7416    Clinical  Impression Statement Patient continues to have visible and palpable left lumbar paraspinal hypertrophy.  Patient reports continued pain but states it is decreased in intensity overall.  His core strength is improving with fewer verbal cues needed with exercises.  Will continue with treatment plan as long as progressing toward goals.  Patient is waiting to be scheduled for an MRI but MD and the patient wish to continue with treatment.     PT Next Visit Plan Progress abdominal bracing series and extensor strengthening (modified planks) ; prone press ups with overpressure;  assess response to dry needling; recheck lumbar AROM; recheck FOTO?        Problem List Patient Active Problem List   Diagnosis Date Noted  . Lumbar degenerative disc disease 10/30/2014    Alvera Singh 12/03/2014, 9:45 AM  [facility] [address] [place], [place], [address] Phone: [phone]   Fax:  [phone]  Ruben Im, PT 12/03/2014 9:45 AM Phone: [phone] Fax: [phone]

## 2014-12-05 ENCOUNTER — Ambulatory Visit: Payer: No Typology Code available for payment source | Admitting: Rehabilitation

## 2014-12-09 ENCOUNTER — Ambulatory Visit (INDEPENDENT_AMBULATORY_CARE_PROVIDER_SITE_OTHER): Payer: Self-pay

## 2014-12-09 DIAGNOSIS — M5136 Other intervertebral disc degeneration, lumbar region: Secondary | ICD-10-CM

## 2014-12-17 ENCOUNTER — Ambulatory Visit: Payer: Self-pay | Attending: Sports Medicine | Admitting: Physical Therapy

## 2014-12-17 DIAGNOSIS — M545 Low back pain, unspecified: Secondary | ICD-10-CM

## 2014-12-17 NOTE — Therapy (Signed)
[place], [place], [address] Phone: [phone]   Fax:  [phone]  Physical Therapy Treatment  Patient Details  Name: Juan Hensley MRN: [id number] Date of Birth: [birth year] Referring Provider:  Silverio Decamp,*  Encounter Date: 12/17/2014      PT End of Session - 12/17/14 1523    Visit Number 7   Number of Visits 16   Date for PT Re-Evaluation 01/02/15   PT Start Time 0845   PT Stop Time 0945   PT Time Calculation (min) 60 min   Activity Tolerance Patient tolerated treatment well      Past Medical History  Diagnosis Date  . Hypertension   . Sinus disease     No past surgical history on file.  There were no vitals taken for this visit.  Visit Diagnosis:  No diagnosis found.      Subjective Assessment - 12/17/14 0852    Symptoms Had MRI last week, gets results on Friday. 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   States 2 days after last appointment he couldn't get out of bed.  Day of treatment Tuesday OK and Wednesday OK. Thursday very tight and sore and stayed in bad all day.  Friday still pain but I could walk. This week has been OK.      Currently in Pain? Yes   Pain Score 5    Pain Location Back   Pain Type Chronic pain   Pain Onset More than a month ago   Pain Frequency Constant   Aggravating Factors  awkard positions at work   Pain Relieving Factors doing stretches especially bending backwards          [facility] PT Assessment - 12/17/14 1520    AROM   Lumbar Flexion 62   Lumbar Extension 40   Lumbar - Right Side Bend 40   Lumbar - Left Side Bend 40   Strength   Lumbar Flexion 4/5   Lumbar Extension 4/5                  [facility] Adult PT Treatment/Exercise - 12/17/14 1519    Lumbar Exercises: Standing   Other Standing Lumbar Exercises Standing lumbar extension over table 10x   Other Standing Lumbar Exercises standing extension with  belt for overpressure 10x   Lumbar Exercises: Prone   Other Prone Lumbar Exercises prone press ups 15x   Moist Heat Therapy   Number Minutes Moist Heat 12 Minutes   Moist Heat Location --  supine lumbar   Manual Therapy   Joint Mobilization lumbar neutral gapping,left hip inferior mob, long axis distract, traction with belt grade 4 5x 30 sec   Myofascial Release lumbar multifidi left          Trigger Point Dry Needling - 12/17/14 1521    Consent Given? 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   Status Achieved           PT Long Term Goals - 12/17/14 1529    PT LONG TERM GOAL #1   Title "Demonstrate and verbalize techniques to reduce the risk of re-injury including: lifting, posture, body mechanics.    Time 8   Period Weeks   Status On-going   PT LONG TERM GOAL #2   Title "Pt will be independent with advanced HEP and appropriate gym program (patient plans on joining [employer])   Time 8   Period Weeks   Status On-going   PT LONG TERM GOAL #3   Title "FOTO will improve from  63%  to  37%   indicating improved functional mobility with less perceived pain   Time 8   Period Weeks   Status On-going   PT LONG TERM GOAL #4   Title Lumbar flexion improved to 40 degrees and left sidebending to 33 degrees needed for work as a Dealer   Status Achieved   PT [place] #5   Title Trunk flexor and  extensor strength to 4+/5 needed for strength/stabilization with work duties and playing with the grandchildren.   Time 8   Period Weeks   Status On-going               Plan - 12/17/14 1524    Clinical Impression Statement Decreased left lumbar paraspinal spasm noted.  Lumbar AROM much improved in all planes.  Continues to respond to extension biased ex.  Patient gets MRI results in Friday.  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     PT Next Visit Plan Progress abdominal bracing series and extensor strengthening (modified planks) ; standing press ups with overpressure;  assess response to dry needling;  recheck FOTO?        Problem List Patient Active Problem List   Diagnosis Date Noted  . Lumbar degenerative disc disease 10/30/2014    Alvera Singh 12/17/2014, 3:32 PM  [facility] [address] [place], [place], [address] Phone: [phone]   Fax:  [phone]    Ruben Im, PT 12/17/2014 3:33 PM Phone: [phone] Fax: [phone]

## 2014-12-19 ENCOUNTER — Ambulatory Visit: Payer: Self-pay | Admitting: Physical Therapy

## 2014-12-19 DIAGNOSIS — M545 Low back pain, unspecified: Secondary | ICD-10-CM

## 2014-12-19 NOTE — Therapy (Signed)
[place], [place], [address] Phone: [phone]   Fax:  [phone]  Physical Therapy Treatment  Patient Details  Name: Juan Hensley MRN: [id number] Date of Birth: [birth year] Referring Provider:  Silverio Decamp,*  Encounter Date: 12/19/2014      PT End of Session - 12/19/14 0940    Visit Number 8   Number of Visits 16   Date for PT Re-Evaluation 01/02/15   PT Start Time 0845   PT Stop Time 0941   PT Time Calculation (min) 56 min   Activity Tolerance Patient tolerated treatment well      Past Medical History  Diagnosis Date  . Hypertension   . Sinus disease     No past surgical history on file.  There were no vitals taken for this visit.  Visit Diagnosis:  Left-sided low back pain without sciatica      Subjective Assessment - 12/19/14 0848    Symptoms Sore from needling in "jackpot" area last visit.  Finished transmission 2 days ago.  Haven't felt like walking.     Currently in Pain? Yes   Pain Score 5    Pain Location Back   Pain Orientation Left   Pain Type Chronic pain   Aggravating Factors  prolonged walking (2 blocks); awkard positions at work   Pain Relieving Factors standing extensions and side to side stretches          [facility] PT Assessment - 12/19/14 0945    Strength   Lumbar Flexion 4/5   Lumbar Extension 4/5                  [facility] Adult PT Treatment/Exercise - 12/19/14 0934    Lumbar Exercises: Supine   Ab Set 5 reps   Bent Knee Raise 5 reps   Straight Leg Raise 10 reps   Isometric Hip Flexion 5 reps   Lumbar Exercises: Quadruped   Opposite Arm/Leg Raise 10 reps   Other Quadruped Lumbar Exercises child's pose with left bias stretch 2x   Moist Heat Therapy   Number Minutes Moist Heat 10 Minutes   Moist Heat Location --  lumbar supine   Manual Therapy   Joint Mobilization lumbar neutral gapping,left hip inferior mob, long axis distract, traction  with belt grade 4 5x 30 sec  left hip A-P in IR grade 3/4 3x 20sec   Myofascial Release lumbar multifidi left          Trigger Point Dry Needling - 12/19/14 0935    Consent Given? 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   Status Achieved           PT Long Term Goals - 12/19/14 0947    PT LONG TERM GOAL #1  Title "Demonstrate and verbalize techniques to reduce the risk of re-injury including: lifting, posture, body mechanics.    Time 8   Period Weeks   Status On-going   PT LONG TERM GOAL #2   Title "Pt will be independent with advanced HEP and appropriate gym program (patient plans on joining [employer])   Time 8   Period Weeks   Status On-going   PT LONG TERM GOAL #3   Title "FOTO will improve from  63%  to  37%   indicating improved functional mobility with less perceived pain   Time 8   Period Weeks   Status On-going   PT LONG TERM GOAL #4   Title Lumbar flexion improved to 40 degrees and left sidebending to 33 degrees needed for work as a Dealer   Status Achieved   PT [place] #5   Title Trunk flexor and extensor strength to 4+/5 needed for strength/stabilization with work duties and playing with the grandchildren.   Time 8   Period Weeks   Status On-going               Plan - 12/19/14 0941    Clinical Impression Statement Lumbar AROM slowly improving.  Core strength 4/5.   Slower progress toward goals despite multiple interventions including manual techniques, dry needling, therapeutic exercise and modalities.  Patient will discuss status with doctor tomorrow as he gets his MRI results.  Probable discharge in 3-4 visits or sooner if no further progress made with PT.   Pt will benefit from skilled therapeutic intervention in order to improve on the following deficits Pain;Increased muscle spasms;Decreased range of motion;Decreased activity tolerance   Rehab Potential Good   PT Frequency 2x / week   PT Next Visit Plan See how doctor's appt went/MRI results;  continue manual interventions if helpful, dry needling to lumbar multifidi; core strengthening;  extension directional preference        Problem List Patient Active Problem List   Diagnosis Date Noted  . Lumbar degenerative disc disease 10/30/2014    Alvera Singh 12/19/2014, 9:48 AM  [facility] [address] [place], [place], [address] Phone: [phone]   Fax:  [phone]   Ruben Im, PT 12/19/2014 9:49 AM Phone: [phone] Fax: [phone]

## 2014-12-20 ENCOUNTER — Ambulatory Visit (INDEPENDENT_AMBULATORY_CARE_PROVIDER_SITE_OTHER): Payer: Self-pay | Admitting: Sports Medicine

## 2014-12-20 ENCOUNTER — Encounter: Payer: Self-pay | Admitting: Sports Medicine

## 2014-12-20 VITALS — BP 179/107 | HR 85 | Ht 68.0 in | Wt 224.0 lb

## 2014-12-20 DIAGNOSIS — M51369 Other intervertebral disc degeneration, lumbar region without mention of lumbar back pain or lower extremity pain: Secondary | ICD-10-CM

## 2014-12-20 DIAGNOSIS — M5136 Other intervertebral disc degeneration, lumbar region: Secondary | ICD-10-CM

## 2014-12-20 DIAGNOSIS — M25522 Pain in left elbow: Secondary | ICD-10-CM

## 2014-12-20 DIAGNOSIS — M19022 Primary osteoarthritis, left elbow: Secondary | ICD-10-CM | POA: Insufficient documentation

## 2014-12-20 MED ORDER — TRAMADOL HCL 50 MG PO TABS: ORAL_TABLET | ORAL | Status: DC

## 2014-12-20 NOTE — Assessment & Plan Note (Signed)
With loss of range of motion, inability to fully extend or flex, with mechanical symptoms. Suspect some degenerative change, certainly I'm concerned about an intra-articular loose body. We are going to start with x-rays, and he can continue his elbow sleeve. We will discuss this further at his follow-up visit.

## 2014-12-20 NOTE — Progress Notes (Signed)
  Subjective:    CC: Follow-up  HPI: Low back pain: Failed physical therapy, steroids, NSAIDs, muscle relaxers. Recently obtain an MRI, the results will be dictated below, pain is worse on the left side, moderate, persistent, no radicular pain but worse with flexion, sitting.  Left elbow pain: Moderate, persistent, present for months, no trauma, unable to fully extend or flex the elbow. Pain is deep inside and at the joint lines.  Past medical history, Surgical history, Family history not pertinant except as noted below, Social history, Allergies, and medications have been entered into the medical record, reviewed, and no changes needed.   Review of Systems: No fevers, chills, night sweats, weight loss, chest pain, or shortness of breath.   Objective:    General: Well Developed, well nourished, and in no acute distress.  Neuro: Alert and oriented x3, extra-ocular muscles intact, sensation grossly intact.  HEENT: Normocephalic, atraumatic, pupils equal round reactive to light, neck supple, no masses, no lymphadenopathy, thyroid nonpalpable.  Skin: Warm and dry, no rashes. Cardiac: Regular rate and rhythm, no murmurs rubs or gallops, no lower extremity edema.  Respiratory: Clear to auscultation bilaterally. Not using accessory muscles, speaking in full sentences. Left Elbow: Unremarkable to inspection. Range of motion has about 5 of lag in flexion and extension Strength is full to all of the above directions Stable to varus, valgus stress. Negative moving valgus stress test. Tender to palpation over the anconeus Ulnar nerve does not sublux. Negative cubital tunnel Tinel's.   MRI shows L3-L4 disc desiccation with a mild protrusion, as well as L4-S1 facet arthritis.  Impression and Recommendations:

## 2014-12-20 NOTE — Assessment & Plan Note (Addendum)
Has only responded minimally to formal physical therapy, and oral medications. MRI does show a moderate disc protrusion with desiccation at the L3-L4 level, pain is predominantly on the left side. 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 We are going to start with a left-sided L3-L4 interlaminar epidural injection, return to see me 3 weeks after the injection to evaluate response, and if no response we will proceed with left-sided L4-L5 and L5-S1 facet injections for diagnostic and therapeutic purposes. Adding tramadol.

## 2014-12-23 ENCOUNTER — Ambulatory Visit: Payer: Self-pay | Admitting: Physician Assistant

## 2014-12-24 ENCOUNTER — Telehealth: Payer: Self-pay | Admitting: Physical Therapy

## 2014-12-24 ENCOUNTER — Ambulatory Visit: Payer: Self-pay | Admitting: Physical Therapy

## 2014-12-24 NOTE — Telephone Encounter (Signed)
Spoke with male in the patient's home.  She states he was on his way to therapy and she doesn't know why he did not attend.  She planned to call him.  Advised of appt on 3/17

## 2014-12-25 ENCOUNTER — Ambulatory Visit
Admission: RE | Admit: 2014-12-25 | Discharge: 2014-12-25 | Disposition: A | Discharge status: Home / Self Care | Payer: Self-pay | Source: Ambulatory Visit | Attending: Sports Medicine | Admitting: Sports Medicine

## 2014-12-25 DIAGNOSIS — M5136 Other intervertebral disc degeneration, lumbar region: Secondary | ICD-10-CM

## 2014-12-25 MED ORDER — IOHEXOL 180 MG/ML  SOLN
1.0000 mL | Freq: Once | INTRAMUSCULAR | Status: AC | PRN
  Administered 2014-12-25: 1 mL via EPIDURAL

## 2014-12-25 MED ORDER — METHYLPREDNISOLONE ACETATE 40 MG/ML INJ SUSP (RADIOLOG
120.0000 mg | Freq: Once | INTRAMUSCULAR | Status: AC
  Administered 2014-12-25: 120 mg via EPIDURAL

## 2014-12-25 NOTE — Discharge Instructions (Signed)

## 2014-12-26 ENCOUNTER — Ambulatory Visit: Payer: Self-pay | Admitting: Physical Therapy

## 2014-12-26 DIAGNOSIS — M545 Low back pain, unspecified: Secondary | ICD-10-CM

## 2014-12-26 NOTE — Patient Instructions (Signed)
Spinal decompression exercises (very low level) in supine

## 2014-12-26 NOTE — Therapy (Addendum)
[place], [place], [address] Phone: [phone]   Fax:  [phone]  Physical Therapy Treatment/Discharge Summary  Patient Details  Name: Juan Hensley MRN: [id number] Date of Birth: 11/28/1970 Referring Provider:  Silverio Decamp,*  Encounter Date: 12/26/2014      PT End of Session - 12/26/14 0843    Visit Number 9   Number of Visits 16   Date for PT Re-Evaluation 01/02/15   PT Start Time 0800   PT Stop Time 0830   PT Time Calculation (min) 30 min   Activity Tolerance Patient tolerated treatment well      Past Medical History  Diagnosis Date  . Hypertension   . Sinus disease     No past surgical history on file.  There were no vitals filed for this visit.  Visit Diagnosis:  Left-sided low back pain without sciatica      Subjective Assessment - 12/26/14 0807    Symptoms Had ESI yesterday.  Sore and slow moving today.  Patient states after last PT session, the needling hit a "jackpot area" and he was painfree that day of therapy.  Sore the next day.  No left thigh pain in > 1 month, after the 2nd or 3rd time of dry needling.     Currently in Pain? Yes   Pain Score 6    Pain Location Back   Pain Orientation Left   Pain Type Chronic pain   Aggravating Factors  prolonged walking 2 blocks; awkward positions at work   Pain Relieving Factors needling; extending his back            [facility] PT Assessment - 12/26/14 0842    Observation/Other Assessments   Focus on Therapeutic Outcomes (FOTO)  46%  much improved from 63%                   [facility] Adult PT Treatment/Exercise - 12/26/14 0841    Lumbar Exercises: Supine   Other Supine Lumbar Exercises 1-5 decompression exercises 5x 2-3 sec holds                PT Education - 12/26/14 0842    Education provided Yes   Education Details spinal decompression exercises in supine   Person(s) Educated Patient   Methods  Explanation;Demonstration;Handout   Comprehension Verbalized understanding;Returned demonstration          PT Short Term Goals - 12/26/14 1740    PT SHORT TERM GOAL #1   Title "Independent with initial HEP   Status Achieved   PT SHORT TERM GOAL #2   Title 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   Status Achieved           PT Long Term Goals - 12/26/14 1740    PT LONG TERM GOAL #1   Title "Demonstrate and verbalize techniques to reduce the risk of re-injury including: lifting, posture, body mechanics.    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   Time 8   Period Weeks   Status Partially Met               Plan - 12/26/14 1735    Clinical Impression Statement Patient reports no LE symptoms since 2nd or 3 rd PT visit.  He reports he attributes that to dry needling and states he has told everyone how much it has helped him.  His back pain just left of midline has  remained despite multiple PT interventions.  He has been instructed in core strengthening as well as extension biased exercises.  His response has been variable.  He underwent ESI yesterday and presents with a flexed forward, slow antalgic gait today.  Lumbar AROM or MMT not performed secondary to this recent procedure.  He did participate in low level supine decompression exercises and stands more upright following session.  The patient would like to discuss with his doctor on whether continuation of PT is needed.  Will place patient on hold and he will call back within 1-2 weeks.       PHYSICAL THERAPY DISCHARGE SUMMARY  Visits from Start of Care: 9  Current functional level related to goals / functional outcomes: See clinical impression statement above.  He did not call back to resume PT following his follow up with the doctor.  Discharge at this time.     Remaining deficits: See above   Education / Equipment: HEP Plan: Patient agrees to discharge.  Patient goals were partially met. Patient is being discharged due to not returning since the last visit.  ?????        Problem List Patient Active Problem List   Diagnosis Date Noted  . Left elbow pain 12/20/2014  . Lumbar degenerative disc disease 10/30/2014    Alvera Singh 12/26/2014, 5:42 PM  [facility] [address] [place], [place], [address] Phone: [phone]   Fax:  [phone]   Ruben Im, [place] 12/26/2014 5:42 PM Phone: [phone] Fax: [phone]

## 2015-01-09 ENCOUNTER — Encounter: Payer: Self-pay | Admitting: Sports Medicine

## 2015-01-09 ENCOUNTER — Ambulatory Visit (INDEPENDENT_AMBULATORY_CARE_PROVIDER_SITE_OTHER): Payer: Self-pay

## 2015-01-09 ENCOUNTER — Ambulatory Visit (INDEPENDENT_AMBULATORY_CARE_PROVIDER_SITE_OTHER): Payer: Self-pay | Admitting: Sports Medicine

## 2015-01-09 VITALS — BP 160/94 | HR 88 | Wt 220.0 lb

## 2015-01-09 DIAGNOSIS — M25422 Effusion, left elbow: Secondary | ICD-10-CM

## 2015-01-09 DIAGNOSIS — M25522 Pain in left elbow: Secondary | ICD-10-CM

## 2015-01-09 DIAGNOSIS — M51369 Other intervertebral disc degeneration, lumbar region without mention of lumbar back pain or lower extremity pain: Secondary | ICD-10-CM

## 2015-01-09 DIAGNOSIS — I10 Essential (primary) hypertension: Secondary | ICD-10-CM

## 2015-01-09 DIAGNOSIS — M5136 Other intervertebral disc degeneration, lumbar region: Secondary | ICD-10-CM

## 2015-01-09 MED ORDER — LISINOPRIL-HYDROCHLOROTHIAZIDE 20-25 MG PO TABS: ORAL_TABLET | ORAL | Status: DC

## 2015-01-09 NOTE — Assessment & Plan Note (Signed)
Does have an appointment to establish care with Iran Planas, PA-C, considering his persistently elevated blood pressure I am going to start lisinopril/HCTZ, and he can follow-up dosing with Jade.

## 2015-01-09 NOTE — Progress Notes (Signed)
  Subjective:    CC: Follow-up after her epidural  HPI: Fantastic response to left sided L3-L4 interlaminar epidural with complete relief of axial and radicular pain.  Left elbow pain: Finally got the x-rays, continues to have extension lag, x-rays did show what appeared to be some loose bodies.  Hypertension: Is establishing with Iran Planas, PA-C, blood pressure continues to be elevated.  Past medical history, Surgical history, Family history not pertinant except as noted below, Social history, Allergies, and medications have been entered into the medical record, reviewed, and no changes needed.   Review of Systems: No fevers, chills, night sweats, weight loss, chest pain, or shortness of breath.   Objective:    General: Well Developed, well nourished, and in no acute distress.  Neuro: Alert and oriented x3, extra-ocular muscles intact, sensation grossly intact.  HEENT: Normocephalic, atraumatic, pupils equal round reactive to light, neck supple, no masses, no lymphadenopathy, thyroid nonpalpable.  Skin: Warm and dry, no rashes. Cardiac: Regular rate and rhythm, no murmurs rubs or gallops, no lower extremity edema.  Respiratory: Clear to auscultation bilaterally. Not using accessory muscles, speaking in full sentences.  Impression and Recommendations:

## 2015-01-09 NOTE — Assessment & Plan Note (Addendum)
With persistent extension lag we do need an x-ray which she has not yet done, if the x-ray is negative we will get an MRI, I do have a concern for intra-articular loose body. I will see him back for MRI results.  Persistent pain with degenerative changes, extension lag and what appear to be intra-articular loose bodies. I'm going to proceed with an MRI and he can come back to go over MRI results.

## 2015-01-09 NOTE — Assessment & Plan Note (Signed)
100% pain relief after a left-sided L3-L4 interlaminar epidural, continues to do well. He also has left-sided L4-5 and L5-S1 facet arthritis which could be future interventional targets. I would like him to continue physical therapy for the time being, he has been back to work and did well.

## 2015-01-13 ENCOUNTER — Ambulatory Visit (INDEPENDENT_AMBULATORY_CARE_PROVIDER_SITE_OTHER): Payer: Self-pay

## 2015-01-13 DIAGNOSIS — M25422 Effusion, left elbow: Secondary | ICD-10-CM

## 2015-01-13 DIAGNOSIS — M24022 Loose body in left elbow: Secondary | ICD-10-CM

## 2015-01-13 DIAGNOSIS — S53442A Ulnar collateral ligament sprain of left elbow, initial encounter: Secondary | ICD-10-CM

## 2015-01-13 DIAGNOSIS — M19022 Primary osteoarthritis, left elbow: Secondary | ICD-10-CM

## 2015-01-13 DIAGNOSIS — M25722 Osteophyte, left elbow: Secondary | ICD-10-CM

## 2015-01-13 DIAGNOSIS — M85622 Other cyst of bone, left upper arm: Secondary | ICD-10-CM

## 2015-01-13 DIAGNOSIS — M7712 Lateral epicondylitis, left elbow: Secondary | ICD-10-CM

## 2015-01-13 DIAGNOSIS — X58XXXA Exposure to other specified factors, initial encounter: Secondary | ICD-10-CM

## 2015-01-13 DIAGNOSIS — M7702 Medial epicondylitis, left elbow: Secondary | ICD-10-CM

## 2015-01-14 ENCOUNTER — Ambulatory Visit (INDEPENDENT_AMBULATORY_CARE_PROVIDER_SITE_OTHER): Payer: Self-pay | Admitting: Physician Assistant

## 2015-01-14 ENCOUNTER — Encounter: Payer: Self-pay | Admitting: Physician Assistant

## 2015-01-14 VITALS — BP 165/108 | HR 90 | Ht 68.0 in | Wt 218.0 lb

## 2015-01-14 DIAGNOSIS — Z131 Encounter for screening for diabetes mellitus: Secondary | ICD-10-CM

## 2015-01-14 DIAGNOSIS — Z7251 High risk heterosexual behavior: Secondary | ICD-10-CM

## 2015-01-14 DIAGNOSIS — I10 Essential (primary) hypertension: Secondary | ICD-10-CM

## 2015-01-14 DIAGNOSIS — F17200 Nicotine dependence, unspecified, uncomplicated: Secondary | ICD-10-CM

## 2015-01-14 DIAGNOSIS — Z23 Encounter for immunization: Secondary | ICD-10-CM

## 2015-01-14 NOTE — Patient Instructions (Signed)
DASH Eating Plan °DASH stands for "Dietary Approaches to Stop Hypertension." The DASH eating plan is a healthy eating plan that has been shown to reduce high blood pressure (hypertension). Additional health benefits may include reducing the risk of type 2 diabetes mellitus, heart disease, and stroke. The DASH eating plan may also help with weight loss. °WHAT DO I NEED TO KNOW ABOUT THE DASH EATING PLAN? °For the DASH eating plan, you will follow these general guidelines: °· Choose foods with a percent daily value for sodium of less than 5% (as listed on the food label). °· Use salt-free seasonings or herbs instead of table salt or sea salt. °· Check with your health care provider or pharmacist before using salt substitutes. °· Eat lower-sodium products, often labeled as "lower sodium" or "no salt added." °· Eat fresh foods. °· Eat more vegetables, fruits, and low-fat dairy products. °· Choose whole grains. Look for the word "whole" as the first word in the ingredient list. °· Choose fish and skinless chicken or turkey more often than red meat. Limit fish, poultry, and meat to 6 oz (170 g) each day. °· Limit sweets, desserts, sugars, and sugary drinks. °· Choose heart-healthy fats. °· Limit cheese to 1 oz (28 g) per day. °· Eat more home-cooked food and less restaurant, buffet, and fast food. °· Limit fried foods. °· Cook foods using methods other than frying. °· Limit canned vegetables. If you do use them, rinse them well to decrease the sodium. °· When eating at a restaurant, ask that your food be prepared with less salt, or no salt if possible. °WHAT FOODS CAN I EAT? °Seek help from a dietitian for individual calorie needs. °Grains °Whole grain or whole wheat bread. Brown rice. Whole grain or whole wheat pasta. Quinoa, bulgur, and whole grain cereals. Low-sodium cereals. Corn or whole wheat flour tortillas. Whole grain cornbread. Whole grain crackers. Low-sodium crackers. °Vegetables °Fresh or frozen vegetables  (raw, steamed, roasted, or grilled). Low-sodium or reduced-sodium tomato and vegetable juices. Low-sodium or reduced-sodium tomato sauce and paste. Low-sodium or reduced-sodium canned vegetables.  °Fruits °All fresh, canned (in natural juice), or frozen fruits. °Meat and Other Protein Products °Ground beef (85% or leaner), grass-fed beef, or beef trimmed of fat. Skinless chicken or turkey. Ground chicken or turkey. Pork trimmed of fat. All fish and seafood. Eggs. Dried beans, peas, or lentils. Unsalted nuts and seeds. Unsalted canned beans. °Dairy °Low-fat dairy products, such as skim or 1% milk, 2% or reduced-fat cheeses, low-fat ricotta or cottage cheese, or plain low-fat yogurt. Low-sodium or reduced-sodium cheeses. °Fats and Oils °Tub margarines without trans fats. Light or reduced-fat mayonnaise and salad dressings (reduced sodium). Avocado. Safflower, olive, or canola oils. Natural peanut or almond butter. °Other °Unsalted popcorn and pretzels. °The items listed above may not be a complete list of recommended foods or beverages. Contact your dietitian for more options. °WHAT FOODS ARE NOT RECOMMENDED? °Grains °Deridder bread. Schmierer pasta. Mccaughey rice. Refined cornbread. Bagels and croissants. Crackers that contain trans fat. °Vegetables °Creamed or fried vegetables. Vegetables in a cheese sauce. Regular canned vegetables. Regular canned tomato sauce and paste. Regular tomato and vegetable juices. °Fruits °Dried fruits. Canned fruit in light or heavy syrup. Fruit juice. °Meat and Other Protein Products °Fatty cuts of meat. Ribs, chicken wings, bacon, sausage, bologna, salami, chitterlings, fatback, hot dogs, bratwurst, and packaged luncheon meats. Salted nuts and seeds. Canned beans with salt. °Dairy °Whole or 2% milk, cream, half-and-half, and cream cheese. Whole-fat or sweetened yogurt. Full-fat   cheeses or blue cheese. Nondairy creamers and whipped toppings. Processed cheese, cheese spreads, or cheese  curds. °Condiments °Onion and garlic salt, seasoned salt, table salt, and sea salt. Canned and packaged gravies. Worcestershire sauce. Tartar sauce. Barbecue sauce. Teriyaki sauce. Soy sauce, including reduced sodium. Steak sauce. Fish sauce. Oyster sauce. Cocktail sauce. Horseradish. Ketchup and mustard. Meat flavorings and tenderizers. Bouillon cubes. Hot sauce. Tabasco sauce. Marinades. Taco seasonings. Relishes. °Fats and Oils °Butter, stick margarine, lard, shortening, ghee, and bacon fat. Coconut, palm kernel, or palm oils. Regular salad dressings. °Other °Pickles and olives. Salted popcorn and pretzels. °The items listed above may not be a complete list of foods and beverages to avoid. Contact your dietitian for more information. °WHERE CAN I FIND MORE INFORMATION? °National Heart, Lung, and Blood Institute: www.nhlbi.nih.gov/health/health-topics/topics/dash/ °Document Released: 09/16/2011 Document Revised: 02/11/2014 Document Reviewed: 08/01/2013 °ExitCare® Patient Information ©2015 [employer]. This information is not intended to replace advice given to you by your health care provider. Make sure you discuss any questions you have with your health care provider. ° °

## 2015-01-14 NOTE — Progress Notes (Signed)
   Subjective:    Patient ID: Juan Hensley, male    DOB: October 24, 1970, 44 y.o.   MRN: [id number]  HPI Patient is a 44 year old male who presents to the clinic with his wife to establish care. He is previously seen Dr. Darene Lamer in office for musculoskeletal issues. He was referred to myself to start managing blood pressure. He was started on lisinopril/HCTZ one half tab daily. He is on the second day with little to no blood pressure reduction. He denies any chest pains, palpitations, headaches or vision changes. He has been checking his blood pressure at home and is a little better than in the office but usually around 155/90. Patient does smoke, currently not exercising and not watching his diet. He does have a strong family history of hypertension.  Patient does not have any past medical history and not on any medications. He has not seen a doctor had any fasting labs in many years.  .. Family History  Problem Relation Age of Onset  . Hypertension Mother   . Hypertension Father    .Marland Kitchen History   Social History  . Marital Status: Single    Spouse Name: N/A  . Number of Children: N/A  . Years of Education: N/A   Occupational History  . Not on file.   Social History Main Topics  . Smoking status: Current Every Day Smoker -- 0.10 packs/day    Types: Cigarettes  . Smokeless tobacco: Not on file  . Alcohol Use: Yes     Comment: social  . Drug Use: Yes    Special: Marijuana  . Sexual Activity: Not on file   Other Topics Concern  . Not on file   Social History Narrative      Review of Systems  All other systems reviewed and are negative.      Objective:   Physical Exam  Constitutional: He is oriented to person, place, and time. He appears well-developed and well-nourished.  HENT:  Head: Normocephalic and atraumatic.  Cardiovascular: Normal rate, regular rhythm and normal heart sounds.   Pulmonary/Chest: Effort normal and breath sounds normal.  Neurological: He is alert and  oriented to person, place, and time.  Skin: Skin is dry.  Psychiatric: He has a normal mood and affect. His behavior is normal.          Assessment & Plan:  Hypertension-blood pressure still not controlled today. Went ahead and told patient to take full tablet of lisinopril HCTZ. Follow-up in 2 weeks with blood pressure check nurse visit only. Discussed other ways to lower blood pressure such as weight loss, smoking cessation and low-salt diet. I did give patient a handout for the [employer].  Tobacco dependence-discussed we need to This in the Next Year. This Could Certainly Decrease His Overall Risk of Cardiac Events in the Future. Patient Is Receptive to This and States He Will Think about His Options.  Patient Has Not Had Screening Labs. We Ordered lipid and CMP Today.  Tetanus Shot Was Given Today with No Complications.  Patient Does Request STD Screening Today. GC Chlamydia Ordered Via Urine As Well As HIV, RPR, Herpes Ordered Via Blood.

## 2015-01-15 LAB — GC/CHLAMYDIA PROBE AMP, URINE
Chlamydia, Swab/Urine, PCR: NEGATIVE
GC PROBE AMP, URINE: NEGATIVE

## 2015-01-16 ENCOUNTER — Encounter: Payer: Self-pay | Admitting: Sports Medicine

## 2015-01-16 ENCOUNTER — Ambulatory Visit (INDEPENDENT_AMBULATORY_CARE_PROVIDER_SITE_OTHER): Payer: Self-pay | Admitting: Sports Medicine

## 2015-01-16 VITALS — BP 157/101 | HR 111 | Ht 68.0 in | Wt 214.0 lb

## 2015-01-16 DIAGNOSIS — M19022 Primary osteoarthritis, left elbow: Secondary | ICD-10-CM

## 2015-01-16 DIAGNOSIS — M1990 Unspecified osteoarthritis, unspecified site: Secondary | ICD-10-CM

## 2015-01-16 NOTE — Assessment & Plan Note (Addendum)
With an MRI confirmed intra-articular loose body. Elbow injection as above, return in one month, arthroscopy if no better.

## 2015-01-16 NOTE — Progress Notes (Signed)
  Subjective:    CC: MRI results  HPI: Left elbow osteoarthritis: Noted on MRI with a loose body posterior to the elbow and proximal to the olecranon fossa, does not appear to be interfering with joint range of motion in any way. Pain is moderate to severe and he is amenable to injection.  Past medical history, Surgical history, Family history not pertinant except as noted below, Social history, Allergies, and medications have been entered into the medical record, reviewed, and no changes needed.   Review of Systems: No fevers, chills, night sweats, weight loss, chest pain, or shortness of breath.   Objective:    General: Well Developed, well nourished, and in no acute distress.  Neuro: Alert and oriented x3, extra-ocular muscles intact, sensation grossly intact.  HEENT: Normocephalic, atraumatic, pupils equal round reactive to light, neck supple, no masses, no lymphadenopathy, thyroid nonpalpable.  Skin: Warm and dry, no rashes. Cardiac: Regular rate and rhythm, no murmurs rubs or gallops, no lower extremity edema.  Respiratory: Clear to auscultation bilaterally. Not using accessory muscles, speaking in full sentences.  MRI shows advanced osteoarthritis of the elbow.  Procedure: Real-time Ultrasound Guided Injection of left elbow Device: GE Logiq E  Verbal informed consent obtained.  Time-out conducted.  Noted no overlying erythema, induration, or other signs of local infection.  Skin prepped in a sterile fashion.  Local anesthesia: Topical Ethyl chloride.  With sterile technique and under real time ultrasound guidance:  25-gauge needle advanced into the elbow joint from an anterior approach, I was able to visualize the effusion, 1 mL kenalog 40, 4 mL lidocaine injected easily. Completed without difficulty  Pain immediately resolved suggesting accurate placement of the medication.  Advised to call if fevers/chills, erythema, induration, drainage, or persistent bleeding.  Images  permanently stored and available for review in the ultrasound unit.  Impression: Technically successful ultrasound guided injection.  Impression and Recommendations:

## 2015-01-28 ENCOUNTER — Ambulatory Visit: Payer: Self-pay

## 2015-01-28 ENCOUNTER — Ambulatory Visit: Payer: Self-pay | Admitting: Physician Assistant

## 2015-02-06 ENCOUNTER — Encounter: Payer: Self-pay | Admitting: *Deleted

## 2015-02-06 ENCOUNTER — Emergency Department (INDEPENDENT_AMBULATORY_CARE_PROVIDER_SITE_OTHER)
Admission: EM | Admit: 2015-02-06 | Discharge: 2015-02-06 | Disposition: A | Discharge status: Home / Self Care | Payer: Self-pay | Source: Home / Self Care | Attending: Emergency Medicine | Admitting: Emergency Medicine

## 2015-02-06 DIAGNOSIS — T783XXA Angioneurotic edema, initial encounter: Secondary | ICD-10-CM

## 2015-02-06 MED ORDER — METHYLPREDNISOLONE ACETATE 80 MG/ML IJ SUSP
80.0000 mg | Freq: Once | INTRAMUSCULAR | Status: AC
  Administered 2015-02-06: 80 mg via INTRAMUSCULAR

## 2015-02-06 NOTE — ED Provider Notes (Addendum)
CSN: [id number]     Arrival date & time 02/06/15  0945 History   First MD Initiated Contact with Patient 02/06/15 [facility] Chief Complaint  Patient presents with  . Oral Swelling    Lips   Here with his fiance HPI Pt reports starting Lisinopril/HCTZ 1/2 pill 3 weeks ago. Developed nonproductive cough and mild constipation. He was increased up to 1 whole pill about 1 1/2 weeks ago.  Taken Dulcolax 2 days ago for constipation, which helped the constipation.  Yesterday, he started developing upper lip swelling. No pain. No pharyngeal swelling, hives, or any breathing difficulty.. Swelling is isolated to lips.  Denies fever or chills or nausea or vomiting. No chest pain or shortness of breath. No abdominal or GI or GU symptoms. Denies itch. No vision problems. Past Medical History  Diagnosis Date  . Hypertension   . Sinus disease    History reviewed. No pertinent past surgical history. Family History  Problem Relation Age of Onset  . Hypertension Mother   . Hypertension Father    History  Substance Use Topics  . Smoking status: Current Every Day Smoker -- 0.10 packs/day    Types: Cigarettes  . Smokeless tobacco: Never Used  . Alcohol Use: Yes     Comment: social    Review of Systems  All other systems reviewed and are negative.   Allergies  Penicillins  Home Medications   Prior to Admission medications   Medication Sig Start Date End Date Taking? Authorizing Provider  lisinopril-hydrochlorothiazide (PRINZIDE,ZESTORETIC) 20-25 MG per tablet One half tab daily for a week then 1 tab daily 01/09/15   Silverio Decamp, MD   BP 123/83 mmHg  Pulse 87  Temp(Src) 98.8 F (37.1 C) (Oral)  Resp 14  Ht 5\' 8"  (1.727 m)  Wt 210 lb (95.255 kg)  BMI 31.94 kg/m2  SpO2 98% Physical Exam  Constitutional: He is oriented to person, place, and time. He appears well-developed and well-nourished. No distress.  HENT:  Head: Normocephalic and atraumatic.   Nose: Nose normal.  Mouth/Throat: Oropharynx is clear and moist.  Prominent lip swelling/edema upper and lower lips, worse in the upper lips. No other facial swelling. Oropharynx clear without any abnormality or swelling.  Eyes: Conjunctivae and EOM are normal. Pupils are equal, round, and reactive to light. No scleral icterus.  Neck: Normal range of motion. No JVD present.  Cardiovascular: Normal rate, regular rhythm and normal heart sounds.   Pulmonary/Chest: Effort normal. No respiratory distress. He has no wheezes. He has no rales.  Abdominal: He exhibits no distension. There is no tenderness.  Musculoskeletal: Normal range of motion.  Lymphadenopathy:    He has no cervical adenopathy.  Neurological: He is alert and oriented to person, place, and time.  Neurologic intact without focal deficit  Skin: Skin is warm. No rash noted.  Psychiatric: He has a normal mood and affect.  Nursing note and vitals reviewed.   ED Course  Procedures (including critical care time) Labs Review Labs Reviewed - No data to display  Imaging Review No results found.   MDM   1. Angioedema of lips, initial encounter    No sign of any airway compromise. Most likely, the angioedema is from the lisinopril. Advised patient and his fiance of this. Advised to DC the lisinopril/HCTZ and not to take any lisinopril or ACE inhibitor's in the future.--Also, I went into the allergy section in EMR and noted this.  Other questions invited and  answered.  Right now, BP normal at 123/83, so I have elected not to start another BP medication at this time. Treatment options discussed, as well as risks, benefits, alternatives. Patient voiced understanding and agreement with the following plans:  Depo-Medrol 80 mg IM stat Push fluids and other symptomatic care Follow-up with PCP within 5-7 days to reevaluate and decide on any other BP med Rx. Precautions discussed. Red flags discussed.--Emergency room if any red  flag Questions invited and answered. They voiced understanding and agreement.   Jacqulyn Cane, MD 02/06/15 1421  Jacqulyn Cane, MD 02/06/15 [phone]

## 2015-02-06 NOTE — ED Notes (Signed)
Pt reports starting Lisinopril/HCTZ 1/2 pill 3 weeks ago. Developed cough and constipation. He was bumped up to 1 whole pill 1 1/2 weeks ago. Taken Dulcolax 2 days ago for constipation then developed lip swelling yesterday. No pharyngeal swelling, hives, SOB. Swelling is isolated to lips.

## 2015-02-07 ENCOUNTER — Telehealth: Payer: Self-pay | Admitting: Physician Assistant

## 2015-02-07 ENCOUNTER — Ambulatory Visit: Payer: Self-pay | Admitting: Physician Assistant

## 2015-02-07 NOTE — Telephone Encounter (Signed)
Call pt: see that he did have an allergic reaction to potentially the lisinopril. We do need to find a BP medication he can take. Please follow up with this.

## 2015-02-10 NOTE — Telephone Encounter (Signed)
Spoke with pt's wife this morning and he already has appt scheduled for tomorrow to f/u.

## 2015-02-11 ENCOUNTER — Ambulatory Visit (INDEPENDENT_AMBULATORY_CARE_PROVIDER_SITE_OTHER): Payer: Self-pay | Admitting: Physician Assistant

## 2015-02-11 ENCOUNTER — Ambulatory Visit: Payer: Self-pay | Admitting: Physician Assistant

## 2015-02-11 VITALS — BP 147/90 | HR 87 | Wt 205.0 lb

## 2015-02-11 DIAGNOSIS — I10 Essential (primary) hypertension: Secondary | ICD-10-CM

## 2015-02-11 MED ORDER — AMLODIPINE BESYLATE 5 MG PO TABS: 5.0000 mg | ORAL_TABLET | Freq: Every day | ORAL | Status: DC

## 2015-02-11 NOTE — Progress Notes (Signed)
Patient came into clinic today for nurse visit BP check. Patient reports he was prescribed Lisinopril for his Hypertension but this caused him to feel lightheaded, caused his lips to swell, and created a cough. Patient stopped taking this Rx after a visit to the UC. Patient is currently not taking any medications. Allergy list and current pharmacy are updated in the system. Advised Pt I will contact him with any medication changes. Verbalized understanding, no further questions.    HTN- lets try low dose norvasc 5mg  once daily. Repeat nurse visit BP reading in next 2 weeks to see if working and if having any side effects. Iran Planas PA-C

## 2015-02-11 NOTE — Progress Notes (Signed)
Advised of new Rx, and to return in 2 weeks for nurse visit BP check. Pt will call in two weeks to schedule. Advised if Pt begins to have side effects from this new Rx to stop immediatly and contact office or go back to UC. Verbalized understanding. No further questions.

## 2015-02-17 ENCOUNTER — Ambulatory Visit: Payer: Self-pay | Admitting: Sports Medicine

## 2015-03-13 ENCOUNTER — Other Ambulatory Visit: Payer: Self-pay | Admitting: Physician Assistant

## 2015-04-14 ENCOUNTER — Other Ambulatory Visit: Payer: Self-pay | Admitting: Family Medicine

## 2015-06-04 ENCOUNTER — Emergency Department (HOSPITAL_COMMUNITY)
Admission: EM | Admit: 2015-06-04 | Discharge: 2015-06-04 | Disposition: A | Discharge status: Home / Self Care | Payer: Self-pay | Attending: Emergency Medicine | Admitting: Emergency Medicine

## 2015-06-04 ENCOUNTER — Encounter (HOSPITAL_COMMUNITY): Payer: Self-pay | Admitting: *Deleted

## 2015-06-04 DIAGNOSIS — I10 Essential (primary) hypertension: Secondary | ICD-10-CM | POA: Insufficient documentation

## 2015-06-04 DIAGNOSIS — Z79899 Other long term (current) drug therapy: Secondary | ICD-10-CM | POA: Insufficient documentation

## 2015-06-04 DIAGNOSIS — W260XXA Contact with knife, initial encounter: Secondary | ICD-10-CM | POA: Insufficient documentation

## 2015-06-04 DIAGNOSIS — S61211A Laceration without foreign body of left index finger without damage to nail, initial encounter: Secondary | ICD-10-CM | POA: Insufficient documentation

## 2015-06-04 DIAGNOSIS — Y9289 Other specified places as the place of occurrence of the external cause: Secondary | ICD-10-CM | POA: Insufficient documentation

## 2015-06-04 DIAGNOSIS — Z880 Allergy status to penicillin: Secondary | ICD-10-CM | POA: Insufficient documentation

## 2015-06-04 DIAGNOSIS — Y998 Other external cause status: Secondary | ICD-10-CM | POA: Insufficient documentation

## 2015-06-04 DIAGNOSIS — Y9389 Activity, other specified: Secondary | ICD-10-CM | POA: Insufficient documentation

## 2015-06-04 DIAGNOSIS — S61219A Laceration without foreign body of unspecified finger without damage to nail, initial encounter: Secondary | ICD-10-CM

## 2015-06-04 DIAGNOSIS — Z720 Tobacco use: Secondary | ICD-10-CM | POA: Insufficient documentation

## 2015-06-04 MED ORDER — BACITRACIN ZINC 500 UNIT/GM EX OINT
TOPICAL_OINTMENT | Freq: Two times a day (BID) | CUTANEOUS | Status: DC
  Administered 2015-06-04: 1 via TOPICAL
  Filled 2015-06-04: qty 0.9

## 2015-06-04 MED ORDER — AMLODIPINE BESYLATE 10 MG PO TABS: 5.0000 mg | ORAL_TABLET | Freq: Every day | ORAL | Status: DC

## 2015-06-04 MED ORDER — ACETAMINOPHEN 500 MG PO TABS: 500.0000 mg | ORAL_TABLET | Freq: Four times a day (QID) | ORAL | Status: DC | PRN

## 2015-06-04 MED ORDER — CLINDAMYCIN HCL 150 MG PO CAPS: 150.0000 mg | ORAL_CAPSULE | Freq: Four times a day (QID) | ORAL | Status: DC

## 2015-06-04 NOTE — ED Notes (Addendum)
Pt states he has a 1cm laceration to his left index finger yesterday at 2pm. Pt states he cut his knuckle while opening an oil can with a putty knife. Pt states he soaked his finger in peroxide for 30 minutes 3 hours after the injury. Wound is no longer bleeding. Pt complains of 9/10 pain in his left index finger. Pt states he also ran out of his BP medication last week and would like to get another prescription.

## 2015-06-04 NOTE — Discharge Instructions (Signed)
Laceration Care, Adult °A laceration is a cut or lesion that goes through all layers of the skin and into the tissue just beneath the skin. °TREATMENT  °Some lacerations may not require closure. Some lacerations may not be able to be closed due to an increased risk of infection. It is important to see your caregiver as soon as possible after an injury to minimize the risk of infection and maximize the opportunity for successful closure. °If closure is appropriate, pain medicines may be given, if needed. The wound will be cleaned to help prevent infection. Your caregiver will use stitches (sutures), staples, wound glue (adhesive), or skin adhesive strips to repair the laceration. These tools bring the skin edges together to allow for faster healing and a better cosmetic outcome. However, all wounds will heal with a scar. Once the wound has healed, scarring can be minimized by covering the wound with sunscreen during the day for 1 full year. °HOME CARE INSTRUCTIONS  °For sutures or staples: °· Keep the wound clean and dry. °· If you were given a bandage (dressing), you should change it at least once a day. Also, change the dressing if it becomes wet or dirty, or as directed by your caregiver. °· Wash the wound with soap and water 2 times a day. Rinse the wound off with water to remove all soap. Pat the wound dry with a clean towel. °· After cleaning, apply a thin layer of the antibiotic ointment as recommended by your caregiver. This will help prevent infection and keep the dressing from sticking. °· You may shower as usual after the first 24 hours. Do not soak the wound in water until the sutures are removed. °· Only take over-the-counter or prescription medicines for pain, discomfort, or fever as directed by your caregiver. °· Get your sutures or staples removed as directed by your caregiver. °For skin adhesive strips: °· Keep the wound clean and dry. °· Do not get the skin adhesive strips wet. You may bathe  carefully, using caution to keep the wound dry. °· If the wound gets wet, pat it dry with a clean towel. °· Skin adhesive strips will fall off on their own. You may trim the strips as the wound heals. Do not remove skin adhesive strips that are still stuck to the wound. They will fall off in time. °For wound adhesive: °· You may briefly wet your wound in the shower or bath. Do not soak or scrub the wound. Do not swim. Avoid periods of heavy perspiration until the skin adhesive has fallen off on its own. After showering or bathing, gently pat the wound dry with a clean towel. °· Do not apply liquid medicine, cream medicine, or ointment medicine to your wound while the skin adhesive is in place. This may loosen the film before your wound is healed. °· If a dressing is placed over the wound, be careful not to apply tape directly over the skin adhesive. This may cause the adhesive to be pulled off before the wound is healed. °· Avoid prolonged exposure to sunlight or tanning lamps while the skin adhesive is in place. Exposure to ultraviolet light in the first year will darken the scar. °· The skin adhesive will usually remain in place for 5 to 10 days, then naturally fall off the skin. Do not pick at the adhesive film. °You may need a tetanus shot if: °· You cannot remember when you had your last tetanus shot. °· You have never had a tetanus   shot. If you get a tetanus shot, your arm may swell, get red, and feel warm to the touch. This is common and not a problem. If you need a tetanus shot and you choose not to have one, there is a rare chance of getting tetanus. Sickness from tetanus can be serious. SEEK MEDICAL CARE IF:   You have redness, swelling, or increasing pain in the wound.  You see a red line that goes away from the wound.  You have yellowish-Ontko fluid (pus) coming from the wound.  You have a fever.  You notice a bad smell coming from the wound or dressing.  Your wound breaks open before or  after sutures have been removed.  You notice something coming out of the wound such as wood or glass.  Your wound is on your hand or foot and you cannot move a finger or toe. SEEK IMMEDIATE MEDICAL CARE IF:   Your pain is not controlled with prescribed medicine.  You have severe swelling around the wound causing pain and numbness or a change in color in your arm, hand, leg, or foot.  Your wound splits open and starts bleeding.  You have worsening numbness, weakness, or loss of function of any joint around or beyond the wound.  You develop painful lumps near the wound or on the skin anywhere on your body. MAKE SURE YOU:   Understand these instructions.  Will watch your condition.  Will get help right away if you are not doing well or get worse. Document Released: 09/27/2005 Document Revised: 12/20/2011 Document Reviewed: 03/23/2011 [facility] Patient Information 2015 [place], [place]. This information is not intended to replace advice given to you by your health care provider. Make sure you discuss any questions you have with your health care provider.   Apply neosporin to finger daily. Return to ED if fevers or chills occurs or if finger becomes more painful, swollen or red. Keep wound clean. Take antibiotics as prescribed.

## 2015-06-04 NOTE — ED Notes (Signed)
Wound care provided, bacitracin and finger splint applied.  Pt verbalized and demonstrated understanding.  Denies questions.

## 2015-06-04 NOTE — ED Provider Notes (Signed)
CSN: [id number]     Arrival date & time 06/04/15  1145 History  This chart was scribed for non-physician practitioner, Carlos Levering, PA-C working with Charlesetta Shanks, MD by Rayna Sexton, ED scribe. This patient was seen in room WTR8/WTR8 and the patient's care was started at 12:36 PM.   Chief Complaint  Patient presents with  . Finger Injury  . Medication Refill   The history is provided by the patient. No language interpreter was used.    HPI Comments: Juan Hensley is a 44 y.o. male, with a hx of HTN, who presents to the Emergency Department complaining of a 1cm laceration with controlled bleeding to his left index finger with onset 1 day ago. Pt notes opening an oil can with a putty knife which slipped and resulted in the injury. He notes initially applying a Band-Aid to the wound, continued working and then applied peroxide. He notes associated pain to the affected finger and rates his current pain as 9/10. Pt denies taking any medications for pain management. He also notes that he ran out of his Norvasc rx last week and requested a refill. Pt confirms being UTD on his TDAP. He denies fevers, chills. Pt is not diabetic.   Past Medical History  Diagnosis Date  . Hypertension   . Sinus disease    History reviewed. No pertinent past surgical history. Family History  Problem Relation Age of Onset  . Hypertension Mother   . Hypertension Father    Social History  Substance Use Topics  . Smoking status: Current Every Day Smoker -- 0.10 packs/day    Types: Cigarettes  . Smokeless tobacco: Never Used  . Alcohol Use: Yes     Comment: social    Review of Systems  Constitutional: Negative for fever and chills.  Musculoskeletal: Positive for joint swelling.  Skin: Positive for wound.  Neurological: Negative for weakness and numbness.  All other systems reviewed and are negative.  Allergies  Lisinopril and Penicillins  Home Medications   Prior to Admission medications    Medication Sig Start Date End Date Taking? Authorizing Provider  amLODipine (NORVASC) 5 MG tablet TAKE ONE TABLET BY MOUTH DAILY 04/15/15   Hali Marry, MD   BP 146/88 mmHg  Pulse 92  Temp(Src) 98.1 F (36.7 C) (Oral)  Resp 18  SpO2 98% Physical Exam  Constitutional: He is oriented to person, place, and time. He appears well-developed and well-nourished. No distress.  HENT:  Head: Normocephalic and atraumatic.  Mouth/Throat: No oropharyngeal exudate.  Eyes: Conjunctivae are normal. Pupils are equal, round, and reactive to light. Right eye exhibits no discharge. Left eye exhibits no discharge.  Neck: Normal range of motion. No tracheal deviation present.  Cardiovascular: Normal rate and intact distal pulses.   Pulses:      Radial pulses are 2+ on the left side.  Pulmonary/Chest: Effort normal. No respiratory distress.  Abdominal: Soft. There is no tenderness.  Musculoskeletal: Normal range of motion.  limited ROM of left index finger due to pain; good cap refill  Neurological: He is alert and oriented to person, place, and time. No cranial nerve deficit.  No sensory deficits  Skin: Skin is warm and dry. He is not diaphoretic.  1 cm horizontal laceration to left index finger knuckle; mild edema; not warm to touch; no sign of infection  Psychiatric: He has a normal mood and affect. His behavior is normal.  Nursing note and vitals reviewed.  ED Course  Procedures  Pt seen Wound irrigated Bacitracin applied to wound Brace applied Pt stable for discharge   DIAGNOSTIC STUDIES: Oxygen Saturation is 98% on RA, normal by my interpretation.    COORDINATION OF CARE: 12:40 PM Discussed treatment plan with pt at bedside including cleaning of the wound, a brace for the affected finger and a refill of his BP rx. Pt agreed to plan.  Labs Review Labs Reviewed - No data to display  Imaging Review No results found. I have personally reviewed and evaluated these images and lab  results as part of my medical decision-making.   EKG Interpretation None      MDM   Final diagnoses:  Laceration of finger of left hand, initial encounter    Pt seen for 1 cm laceration to L index finger. Injury occurred yesterday around 2pm, unable to suture close laceration due to time delay. No signs of infection. No neurological deficits. Unlikely to have a tendon injury. Good cap refill. Wound was irrigated. Bacitracin applied. Brace applied due to location of laceration on knuckle. Wound may keep opening with flexion of finger. Pt given keflex as prophylaxis to infection. 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 Higher risk of infection. Pt advised to return if pain worsens or if fevers or chills occur.  Will refill home BP medication.   I personally performed the services described in this documentation, which was scribed in my presence. The recorded information has been reviewed and is accurate.    Dondra Spry Crary, PA-C 06/04/15 1430  Charlesetta Shanks, MD 06/18/15 [phone]

## 2015-10-15 ENCOUNTER — Encounter: Payer: Self-pay | Admitting: Physician Assistant

## 2015-10-15 ENCOUNTER — Ambulatory Visit (INDEPENDENT_AMBULATORY_CARE_PROVIDER_SITE_OTHER): Payer: Self-pay | Admitting: Physician Assistant

## 2015-10-15 VITALS — BP 152/95 | HR 95 | Ht 68.0 in | Wt 227.0 lb

## 2015-10-15 DIAGNOSIS — M19022 Primary osteoarthritis, left elbow: Secondary | ICD-10-CM

## 2015-10-15 DIAGNOSIS — I10 Essential (primary) hypertension: Secondary | ICD-10-CM

## 2015-10-15 DIAGNOSIS — M1990 Unspecified osteoarthritis, unspecified site: Secondary | ICD-10-CM

## 2015-10-15 DIAGNOSIS — M5136 Other intervertebral disc degeneration, lumbar region: Secondary | ICD-10-CM

## 2015-10-15 MED ORDER — NAPROXEN 500 MG PO TABS: 500.0000 mg | ORAL_TABLET | Freq: Two times a day (BID) | ORAL | Status: DC

## 2015-10-15 MED ORDER — CYCLOBENZAPRINE HCL 10 MG PO TABS: 10.0000 mg | ORAL_TABLET | Freq: Three times a day (TID) | ORAL | Status: DC | PRN

## 2015-10-15 MED ORDER — AMLODIPINE BESYLATE 10 MG PO TABS: 10.0000 mg | ORAL_TABLET | Freq: Every day | ORAL | Status: DC

## 2015-10-15 MED ORDER — TRAMADOL HCL 50 MG PO TABS: 50.0000 mg | ORAL_TABLET | Freq: Four times a day (QID) | ORAL | Status: DC | PRN

## 2015-10-15 NOTE — Progress Notes (Signed)
   Subjective:    Patient ID: Juan Hensley, male    DOB: 17-Mar-1971, 45 y.o.   MRN: [id number]  HPI Pt is a 45 yo male who presents to the clinic for medication refill for HTN. He has been out of norvasc for 2 months. He does not have insurance and hard for patient to come in to clinic. Denies any CP, paliptiations, headaches, vision changes.   He continues to have lumbar pain and left elbow pain. Out of all the meds that were given by Dr. Darene Lamer. He would like refills if he could. Epidural injection helped significantly but cannot afford until he gets insurance.    Review of Systems  All other systems reviewed and are negative.      Objective:   Physical Exam  Constitutional: He is oriented to person, place, and time. He appears well-developed and well-nourished.  HENT:  Head: Normocephalic and atraumatic.  Cardiovascular: Normal rate, regular rhythm and normal heart sounds.   Pulmonary/Chest: Effort normal and breath sounds normal.  Neurological: He is alert and oriented to person, place, and time.  Psychiatric: He has a normal mood and affect. His behavior is normal.          Assessment & Plan:  HTN- refilled norvasc. Restart today. Recheck nurse visit in 1 month. i am aware he does not have insurance. Bmp to have drawn when comes back for nurse visit.   Arthritis of elbow/Lumbar DDD- refilled tramadol, flexeril, naprosyn. Pt does not have insurance but he is looking for some. Follow up with Dr. Darene Lamer for further management.

## 2015-10-16 ENCOUNTER — Telehealth: Payer: Self-pay | Admitting: *Deleted

## 2015-10-16 NOTE — Telephone Encounter (Signed)
Ok to reprint tramadol. Let them know it is important to keep these rx as are controlled substances and monitored closing.

## 2015-10-16 NOTE — Telephone Encounter (Signed)
Pt's wife left vm this morning stating that they have lost the "paper prescription" you gave them yesterday.  I'm assuming this would be the tramadol.  What would you like to do with this situation? Please advise.

## 2015-10-17 ENCOUNTER — Other Ambulatory Visit: Payer: Self-pay | Admitting: *Deleted

## 2015-10-17 MED ORDER — TRAMADOL HCL 50 MG PO TABS: 50.0000 mg | ORAL_TABLET | Freq: Four times a day (QID) | ORAL | Status: DC | PRN

## 2015-10-29 NOTE — Telephone Encounter (Signed)
Done

## 2015-11-17 ENCOUNTER — Ambulatory Visit (INDEPENDENT_AMBULATORY_CARE_PROVIDER_SITE_OTHER): Payer: Self-pay | Admitting: Physician Assistant

## 2015-11-17 VITALS — BP 153/88 | HR 106

## 2015-11-17 DIAGNOSIS — I10 Essential (primary) hypertension: Secondary | ICD-10-CM

## 2015-11-17 MED ORDER — METOPROLOL SUCCINATE ER 25 MG PO TB24: 25.0000 mg | ORAL_TABLET | Freq: Every day | ORAL | Status: DC

## 2015-11-17 NOTE — Progress Notes (Signed)
   Subjective:    Patient ID: Juan Hensley, male    DOB: July 07, 1971, 45 y.o.   MRN: [id number]  HPI  Patient here for a BP check, notified J. Breeback, and added new medication.  Denies any problems with medications presently on.    Review of Systems     Objective:   Physical Exam        Assessment & Plan:  Medication ordered sent to pharmacy.  Patient advised to follow up in 1 month for blood pressure check.  Added metoprol XL 25mg  once daily.

## 2015-12-15 ENCOUNTER — Ambulatory Visit (INDEPENDENT_AMBULATORY_CARE_PROVIDER_SITE_OTHER): Payer: Self-pay | Admitting: Family Medicine

## 2015-12-15 VITALS — BP 141/83 | HR 92

## 2015-12-15 DIAGNOSIS — I10 Essential (primary) hypertension: Secondary | ICD-10-CM

## 2015-12-15 MED ORDER — METOPROLOL SUCCINATE ER 50 MG PO TB24: 50.0000 mg | ORAL_TABLET | Freq: Every day | ORAL | Status: DC

## 2015-12-15 NOTE — Progress Notes (Signed)
   Subjective:    Patient ID: SEN HARPENAU, male    DOB: 09/30/1971, 45 y.o.   MRN: [id number]  HPI    Review of Systems     Objective:   Physical Exam        Assessment & Plan:  HTN  - Patient was seen approximately one month ago for elevated blood pressure in a nurse visit. Metoprolol was added to his regimen. We'll increase to 50 mg and then have him follow-up with his PCP in one month.  Beatrice Lecher, MD

## 2015-12-15 NOTE — Progress Notes (Signed)
   Subjective:    Patient ID: Juan Hensley, male    DOB: 06/28/1971, 45 y.o.   MRN: [id number]  HPI    Pt denies chest pain, SOB, dizziness, or heart palpitations.  Taking meds as directed w/o problems.  Denies medication side effects.  5 min spent with pt.  Review of Systems     Objective:   Physical Exam        Assessment & Plan:

## 2015-12-16 ENCOUNTER — Telehealth: Payer: Self-pay | Admitting: *Deleted

## 2015-12-16 NOTE — Telephone Encounter (Signed)
Pt notified and voiced understanding of dr's advice. Asked that he call back to schedule one month f/u with [facility]

## 2016-01-12 ENCOUNTER — Ambulatory Visit (INDEPENDENT_AMBULATORY_CARE_PROVIDER_SITE_OTHER): Payer: Self-pay | Admitting: Physician Assistant

## 2016-01-12 ENCOUNTER — Encounter: Payer: Self-pay | Admitting: Physician Assistant

## 2016-01-12 VITALS — BP 132/78 | HR 85 | Ht 68.0 in | Wt 221.0 lb

## 2016-01-12 DIAGNOSIS — I10 Essential (primary) hypertension: Secondary | ICD-10-CM

## 2016-01-12 MED ORDER — AMLODIPINE BESYLATE 10 MG PO TABS: 10.0000 mg | ORAL_TABLET | Freq: Every day | ORAL | Status: DC

## 2016-01-12 MED ORDER — METOPROLOL SUCCINATE ER 50 MG PO TB24: 50.0000 mg | ORAL_TABLET | Freq: Every day | ORAL | Status: DC

## 2016-01-12 NOTE — Progress Notes (Signed)
   Subjective:    Patient ID: Juan Hensley, male    DOB: November 15, 1970, 45 y.o.   MRN: [id number]  HPI Pt is a 45 yo male who presents to the clinic for BP recheck. Denies any CP, palpitations, SOB, HA, and dizziness. Taking medications daily. Trying to watch salt in diet. Increased to metprolol 50mg  at last nurse visit.    Review of Systems  All other systems reviewed and are negative.      Objective:   Physical Exam  Constitutional: He is oriented to person, place, and time. He appears well-developed and well-nourished.  HENT:  Head: Normocephalic and atraumatic.  Cardiovascular: Normal rate, regular rhythm and normal heart sounds.   Pulmonary/Chest: Effort normal and breath sounds normal.  Neurological: He is alert and oriented to person, place, and time.  Skin: Skin is dry.  Psychiatric: He has a normal mood and affect. His behavior is normal.          Assessment & Plan:  HTN- looks great today. Refilled metoprolol 50mg  and norvasc 10mg  daily for 6 months. Encouraged low salt diet.

## 2016-05-09 IMAGING — MR MR LUMBAR SPINE W/O CM
4 of 5 series · 26 of 48 positions shown · non-contrast
Comparison: None.

CLINICAL DATA: He is doing low back pain. Left buttock pain. No
numbness.

EXAM:
MRI LUMBAR SPINE WITHOUT CONTRAST
TECHNIQUE: Multiplanar, multisequence MR imaging of the lumbar spine was
performed. No intravenous contrast was administered.

[Series 2: T2 · sagittal · 4.0mm · 0.81mm/px · 6 of 15 slices shown (1 of 2)]
[im 1/15]
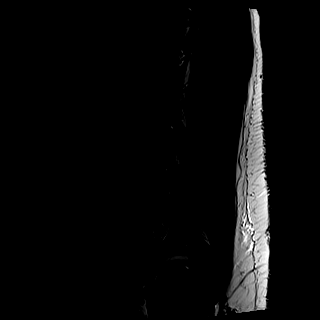
[im 3/15]
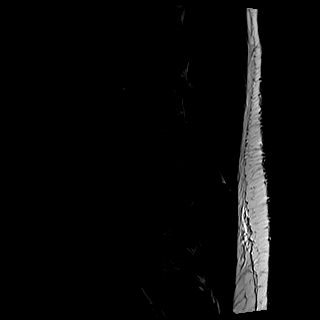
[im 6/15]
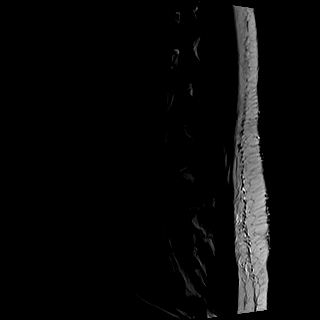
[im 9/15]
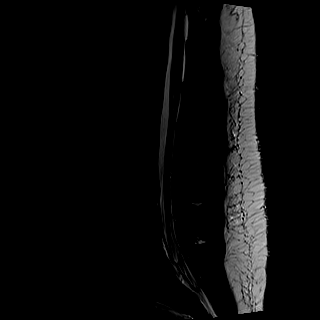
[im 12/15]
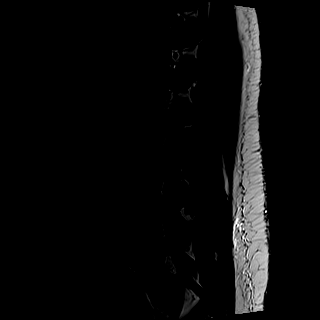
[im 15/15]
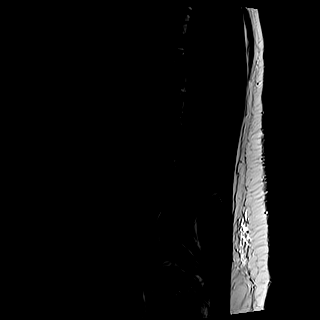

[Series 3: T1 · sagittal · 4.0mm · 0.41mm/px · 6 of 15 slices shown (1 of 2)]
[im 1/15]
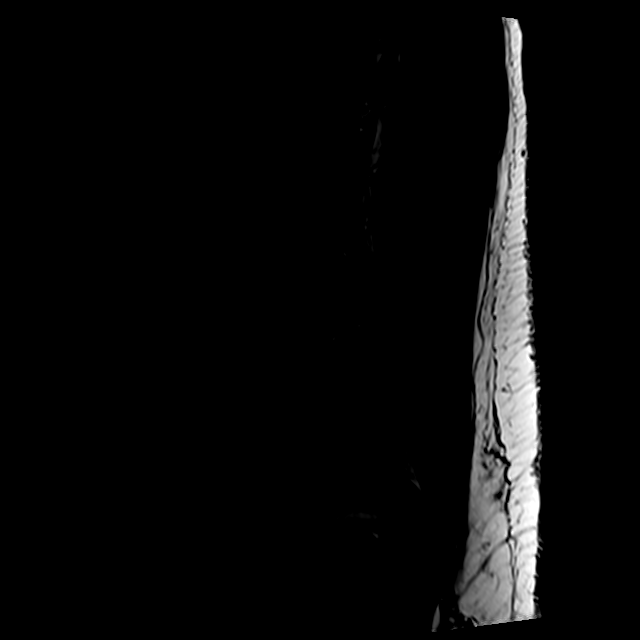
[im 3/15]
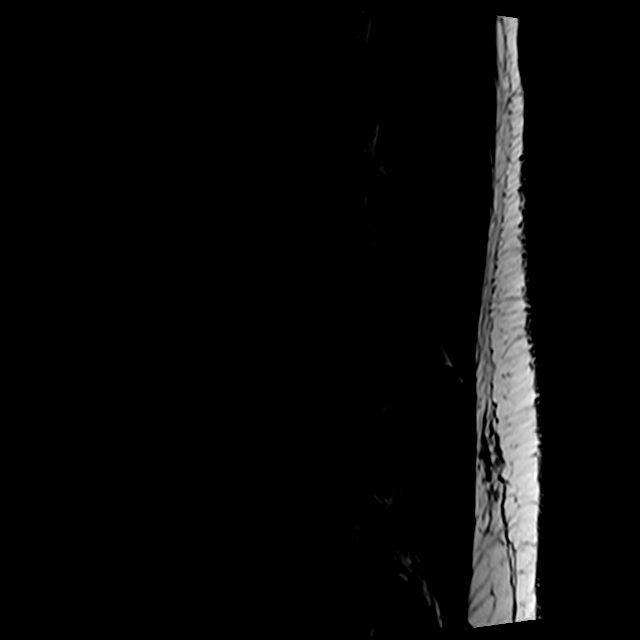
[im 6/15]
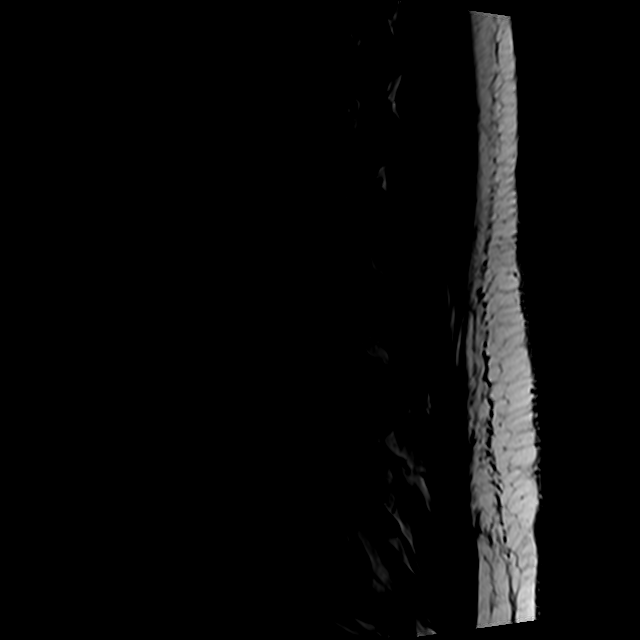
[im 9/15]
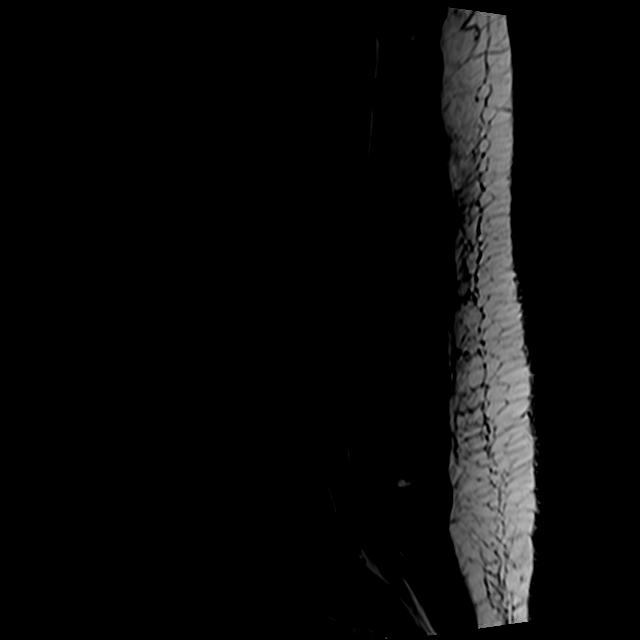
[im 12/15]
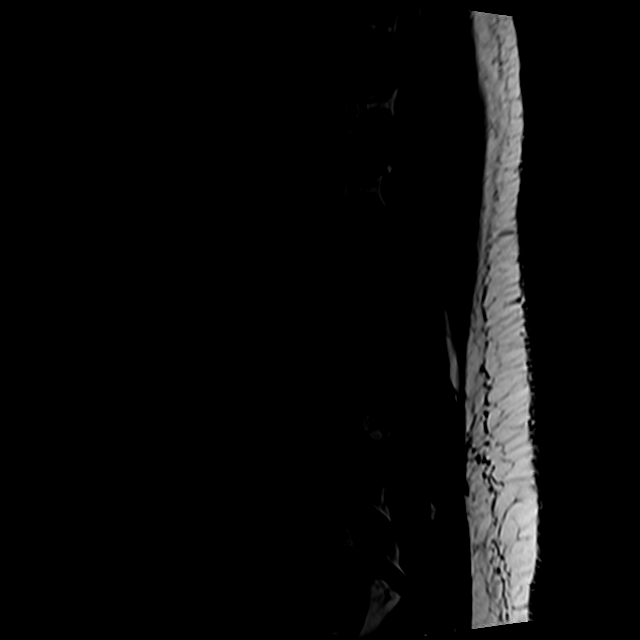
[im 15/15]
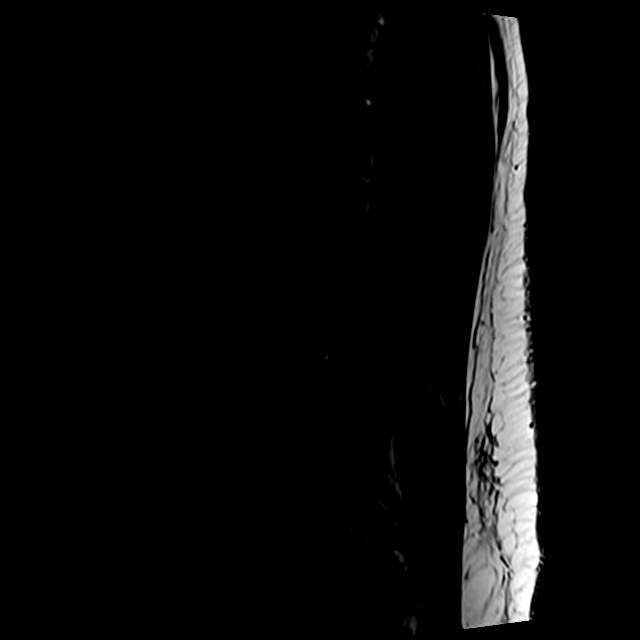

[Series 5: T2 · axial · 4.0mm · 0.78mm/px · z∈[-48,+172]mm · 9 of 39 slices shown (2 of 2)]
[im 1/39]
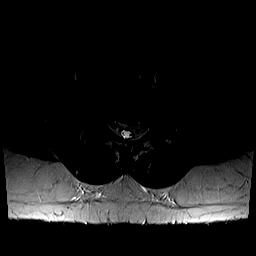
[im 6/39]
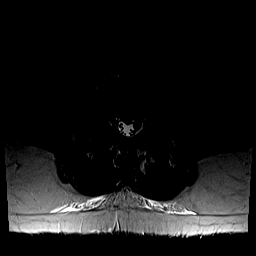
[im 11/39]
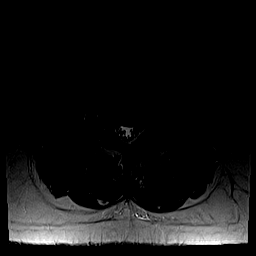
[im 17/39]
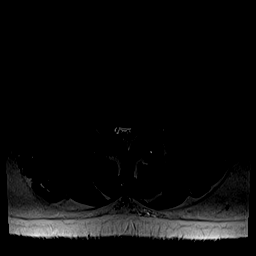
[im 20/39]
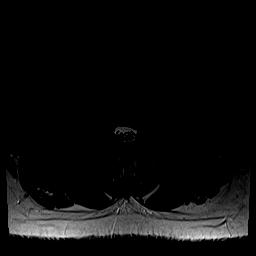
[im 22/39]
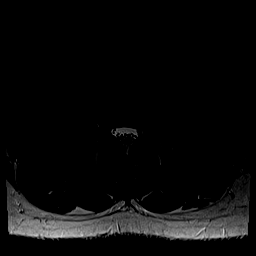
[im 28/39]
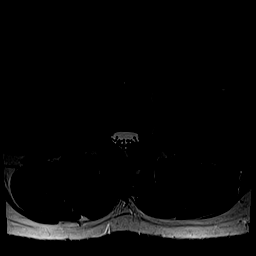
[im 33/39]
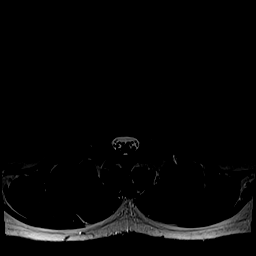
[im 39/39]
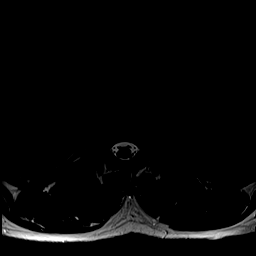

[Series 6: T1 · axial · 4.0mm · 0.39mm/px · z∈[-48,+142]mm · 5 of 39 slices shown (2 of 2)]
[im 1/39]
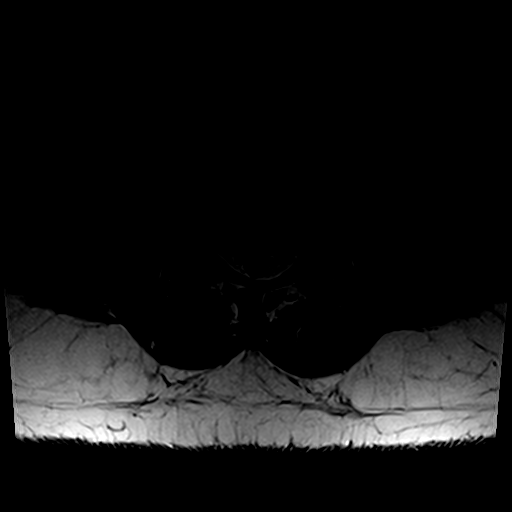
[im 6/39]
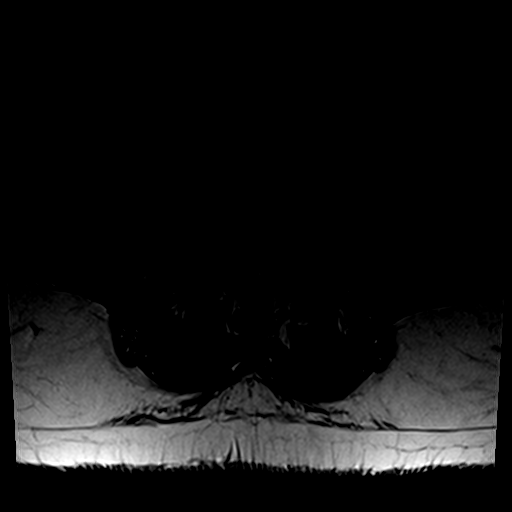
[im 11/39]
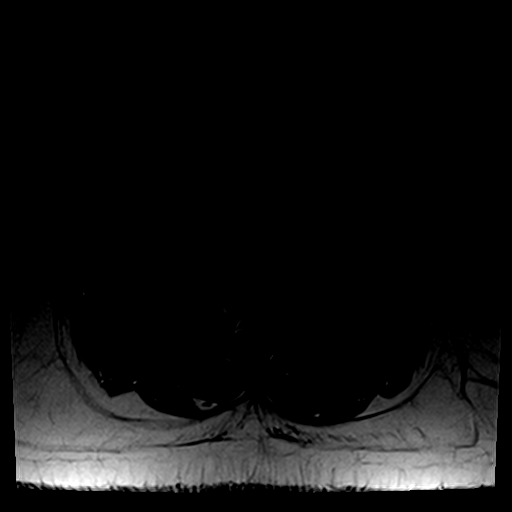
[im 20/39]
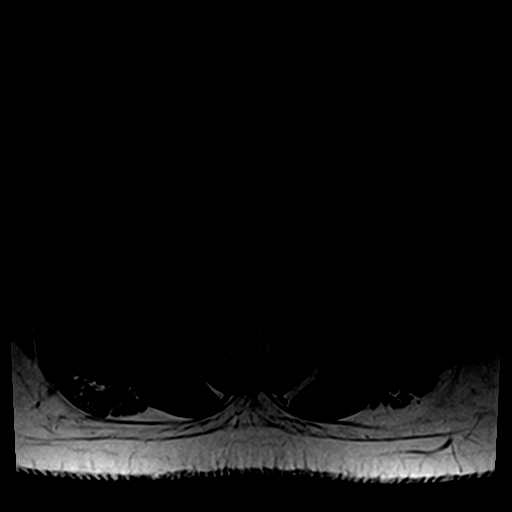
[im 33/39]
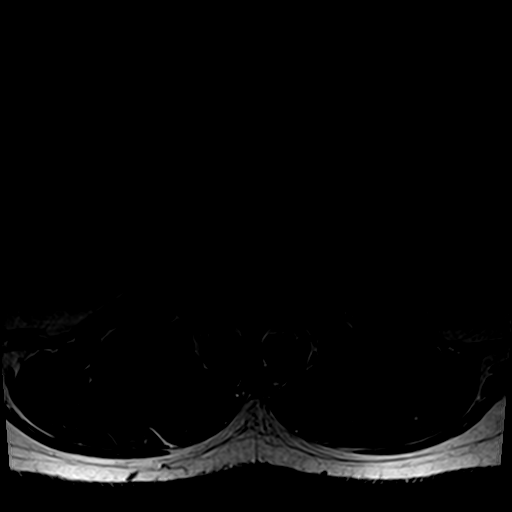

[26 of 48 positions shown; findings below may reference images not displayed]

FINDINGS: The vertebral bodies of the lumbar spine are normal in size. The
vertebral bodies of the lumbar spine are normal in alignment. There
is normal bone marrow signal demonstrated throughout the vertebra.
The intervertebral disc spaces are well-maintained. Mild disc
desiccation at L3-4.

The spinal cord is normal in signal and contour. The cord terminates
normally at L1 . The nerve roots of the cauda equina and the filum
terminale are normal.

The visualized portions of the SI joints are unremarkable.

The imaged intra-abdominal contents are unremarkable.

T12-L1: No significant disc bulge. No evidence of neural foraminal
stenosis. No central canal stenosis.

L1-L2: No significant disc bulge. No evidence of neural foraminal
stenosis. No central canal stenosis.

L2-L3: No significant disc bulge. No evidence of neural foraminal
stenosis. No central canal stenosis.

L3-L4: Mild broad-based disc bulge. Prominence of the epidural fat
along the left lateral aspect of the thecal sac. Moderate left facet
arthropathy and mild right facet arthropathy. No evidence of neural
foraminal stenosis. No central canal stenosis.

L4-L5: Mild broad-based disc bulge. Moderate bilateral facet
arthropathy. No evidence of neural foraminal stenosis. No central
canal stenosis.

L5-S1: No significant disc bulge. No evidence of neural foraminal
stenosis. No central canal stenosis.
IMPRESSION: 1. At L3-4 there is mild disc desiccation with a mild broad-based
disc bulge. There is moderate left and mild right facet arthropathy.
2. At L4-5 there is a mild broad-based disc bulge and moderate
bilateral facet arthropathy.

## 2016-07-13 ENCOUNTER — Ambulatory Visit: Payer: Self-pay | Admitting: Physician Assistant

## 2016-08-02 ENCOUNTER — Encounter: Payer: Self-pay | Admitting: Physician Assistant

## 2016-08-02 ENCOUNTER — Ambulatory Visit (INDEPENDENT_AMBULATORY_CARE_PROVIDER_SITE_OTHER): Payer: Self-pay | Admitting: Physician Assistant

## 2016-08-02 VITALS — BP 138/76 | HR 79 | Ht 68.0 in | Wt 230.0 lb

## 2016-08-02 DIAGNOSIS — Z79899 Other long term (current) drug therapy: Secondary | ICD-10-CM

## 2016-08-02 DIAGNOSIS — I10 Essential (primary) hypertension: Secondary | ICD-10-CM

## 2016-08-02 MED ORDER — METOPROLOL SUCCINATE ER 50 MG PO TB24: 50.0000 mg | ORAL_TABLET | Freq: Every day | ORAL | 1 refills | Status: DC

## 2016-08-02 MED ORDER — AMLODIPINE BESYLATE 10 MG PO TABS: 10.0000 mg | ORAL_TABLET | Freq: Every day | ORAL | 1 refills | Status: DC

## 2016-08-02 NOTE — Progress Notes (Signed)
   Subjective:    Patient ID: Juan Hensley, male    DOB: [birth year], 45 y.o.   MRN: [id number]  HPI Pt is a 45 yo male who presents to the clinic for HTN follow up. He does not have insurance has not had labs done due to cost. He is taking norvasc and Toprol XL daily. He denies any CP, palpitations, SOB, headaches, or dizziness. He is not check BP at home.    Review of Systems See HPI.     Objective:   Physical Exam  Constitutional: He is oriented to person, place, and time. He appears well-developed and well-nourished.  HENT:  Head: Normocephalic and atraumatic.  Neck: Normal range of motion. Neck supple. No thyromegaly present.  Cardiovascular: Normal rate, regular rhythm and normal heart sounds.   Pulmonary/Chest: Effort normal and breath sounds normal.  Neurological: He is alert and oriented to person, place, and time.  Psychiatric: He has a normal mood and affect. His behavior is normal.          Assessment & Plan:  HTN- BP looks good today. Refilled medications for 6 months. Ok not to do lipid but encouraged to get BMP and pay out of pocket. Follow up in 6 months.   All health maintenance denied due to cost.

## 2016-08-20 ENCOUNTER — Other Ambulatory Visit: Payer: Self-pay | Admitting: Physician Assistant

## 2017-01-11 ENCOUNTER — Other Ambulatory Visit: Payer: Self-pay | Admitting: Physician Assistant

## 2017-01-31 ENCOUNTER — Ambulatory Visit: Payer: Self-pay | Admitting: Physician Assistant

## 2017-02-14 ENCOUNTER — Ambulatory Visit (INDEPENDENT_AMBULATORY_CARE_PROVIDER_SITE_OTHER): Payer: Self-pay | Admitting: Physician Assistant

## 2017-02-14 ENCOUNTER — Encounter: Payer: Self-pay | Admitting: Physician Assistant

## 2017-02-14 VITALS — BP 128/88 | HR 86 | Ht 68.0 in | Wt 225.0 lb

## 2017-02-14 DIAGNOSIS — I10 Essential (primary) hypertension: Secondary | ICD-10-CM

## 2017-02-14 DIAGNOSIS — M5136 Other intervertebral disc degeneration, lumbar region: Secondary | ICD-10-CM

## 2017-02-14 MED ORDER — AMLODIPINE BESYLATE 10 MG PO TABS: 10.0000 mg | ORAL_TABLET | Freq: Every day | ORAL | 1 refills | Status: DC

## 2017-02-14 MED ORDER — METOPROLOL SUCCINATE ER 50 MG PO TB24: 50.0000 mg | ORAL_TABLET | Freq: Every day | ORAL | 1 refills | Status: DC

## 2017-02-14 MED ORDER — PREDNISONE 20 MG PO TABS: ORAL_TABLET | ORAL | 0 refills | Status: DC

## 2017-02-14 NOTE — Progress Notes (Signed)
   Subjective:    Patient ID: NICKLAUS ALVIAR, male    DOB: [birth year], 46 y.o.   MRN: [id number]  HPI Pt is a 46 yo male who presents to the clinic with his wife for HTN follow up. He is taking norvasc and metoprolol daily. Denies any CP, palpitations, headaches, vision changes, SOB. He is not checking his BP at home.    He has been seen in the past for LDD by Dr. Darene Lamer. He at one time had injection in back. It helped symptoms for a long time but they have returned. He completed numerous weeks of PT in the past but does not feel like they helped his symptoms. He continues to not have insurance and struggle to pay for the treatment. He continues to work full time and responsible for a lot of heavy lifting and twisting. Pt denies any radiation of pain or numbness and tingling down leg, saddle anesthesia, bowel or bladder dysfunction.    Review of Systems  All other systems reviewed and are negative.      Objective:   Physical Exam  Constitutional: He is oriented to person, place, and time. He appears well-developed and well-nourished.  HENT:  Head: Normocephalic and atraumatic.  Cardiovascular: Normal rate, regular rhythm and normal heart sounds.   Pulmonary/Chest: Effort normal and breath sounds normal.  Neurological: He is alert and oriented to person, place, and time.  Psychiatric: He has a normal mood and affect. His behavior is normal.          Assessment & Plan:  Marland KitchenMarland KitchenAeron was seen today for hypertension.  Diagnoses and all orders for this visit:  Essential hypertension, benign -     amLODipine (NORVASC) 10 MG tablet; Take 1 tablet (10 mg total) by mouth daily. -     metoprolol succinate (TOPROL-XL) 50 MG 24 hr tablet; Take 1 tablet (50 mg total) by mouth daily. Take with or immediately following a meal.  Lumbar degenerative disc disease -     predniSONE (DELTASONE) 20 MG tablet; Take 3 tablets for 3 days, take 2 tablets for 3 days, take 1 tablet for 3 days, take 1/2 tablet for 4  days.   Refilled BP medication for next 6 months. Discussed patient MUST get a BMP at next visit so we can evaluate kidney function on medications.   No red flag symptoms of back pain. Burst of prednisone sent for LDD. Encouraged to work on stretches and exercise learned by PT in 2016. Encouraged massage to help with muscle spasms around back pain. Flexeril as needed for muscle spasms. If continues to have back pain need to consider follow up with Dr. Darene Lamer.

## 2017-08-17 ENCOUNTER — Encounter: Payer: Self-pay | Admitting: Physician Assistant

## 2017-08-17 ENCOUNTER — Ambulatory Visit (INDEPENDENT_AMBULATORY_CARE_PROVIDER_SITE_OTHER): Payer: Self-pay | Admitting: Physician Assistant

## 2017-08-17 DIAGNOSIS — I10 Essential (primary) hypertension: Secondary | ICD-10-CM

## 2017-08-17 MED ORDER — METOPROLOL SUCCINATE ER 50 MG PO TB24: 50.0000 mg | ORAL_TABLET | Freq: Every day | ORAL | 3 refills | Status: DC

## 2017-08-17 MED ORDER — AMLODIPINE BESYLATE 10 MG PO TABS: 10.0000 mg | ORAL_TABLET | Freq: Every day | ORAL | 3 refills | Status: DC

## 2017-08-17 NOTE — Patient Instructions (Signed)
Cholesterol level- lipid panel Kidney/liver/glucose- CMP

## 2017-08-17 NOTE — Progress Notes (Signed)
   Subjective:    Patient ID: Juan Hensley, male    DOB: 1971/10/10, 46 y.o.   MRN: [id number]  HPI Patient is a 46 year old African-American male who presents to the clinic with his wife.  He is here for his 57-month follow-up on blood pressure.  He is self-pay.  He is taking his Norvasc and metoprolol daily.  He denies any chest pains, palpitations, shortness of breath, headaches or vision changes.  He is not complaining of any peripheral edema.  He feels great today.  .. Active Ambulatory Problems    Diagnosis Date Noted  . Lumbar degenerative disc disease 10/30/2014  . 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 Essential hypertension, benign 01/09/2015  . Tobacco dependence 01/14/2015   Resolved Ambulatory Problems    Diagnosis Date Noted  . No Resolved Ambulatory Problems   Past Medical History:  Diagnosis Date  . Hypertension   . Sinus disease       Review of Systems  All other systems reviewed and are negative.      Objective:   Physical Exam  Constitutional: He is oriented to person, place, and time. He appears well-developed and well-nourished.  HENT:  Head: Normocephalic and atraumatic.  Neck: No thyromegaly present.  Cardiovascular: Normal rate, regular rhythm and normal heart sounds.  Pulmonary/Chest: Effort normal and breath sounds normal. He has no wheezes.  Lymphadenopathy:    He has no cervical adenopathy.  Neurological: He is alert and oriented to person, place, and time.  Skin:  No edema seen.   Psychiatric: He has a normal mood and affect. His behavior is normal.          Assessment & Plan:  Marland KitchenMarland KitchenLinard was seen today for hypertension.  Diagnoses and all orders for this visit:  Essential hypertension, benign -     amLODipine (NORVASC) 10 MG tablet; Take 1 tablet (10 mg total) daily by mouth. -     metoprolol succinate (TOPROL-XL) 50 MG 24 hr tablet; Take 1 tablet (50 mg total) daily by mouth. Take with or immediately following a meal.   Refills  were given to patient.  We had a long discussion about how he must get labs done.  He agrees to do this.  He would like to price around the cheapest labs.  I told him he will need a lipid panel and a CMP.  Once he finds the lab he would like to go to we can send this order.  Discussed hypertension is controlled but now we need to make sure he is not a diabetic he does not have any underlying cholesterol problem.

## 2018-09-11 ENCOUNTER — Other Ambulatory Visit: Payer: Self-pay | Admitting: Physician Assistant

## 2018-09-11 DIAGNOSIS — I10 Essential (primary) hypertension: Secondary | ICD-10-CM

## 2018-10-23 ENCOUNTER — Ambulatory Visit (INDEPENDENT_AMBULATORY_CARE_PROVIDER_SITE_OTHER): Payer: Self-pay | Admitting: Physician Assistant

## 2018-10-23 ENCOUNTER — Encounter: Payer: Self-pay | Admitting: Physician Assistant

## 2018-10-23 VITALS — BP 152/100 | HR 93 | Ht 68.0 in | Wt 217.0 lb

## 2018-10-23 DIAGNOSIS — Z13220 Encounter for screening for lipoid disorders: Secondary | ICD-10-CM

## 2018-10-23 DIAGNOSIS — Z131 Encounter for screening for diabetes mellitus: Secondary | ICD-10-CM

## 2018-10-23 DIAGNOSIS — M5136 Other intervertebral disc degeneration, lumbar region: Secondary | ICD-10-CM

## 2018-10-23 DIAGNOSIS — I10 Essential (primary) hypertension: Secondary | ICD-10-CM

## 2018-10-23 DIAGNOSIS — H6981 Other specified disorders of Eustachian tube, right ear: Secondary | ICD-10-CM

## 2018-10-23 DIAGNOSIS — M25561 Pain in right knee: Secondary | ICD-10-CM

## 2018-10-23 DIAGNOSIS — M5416 Radiculopathy, lumbar region: Secondary | ICD-10-CM

## 2018-10-23 MED ORDER — NAPROXEN 500 MG PO TABS: 500.0000 mg | ORAL_TABLET | Freq: Two times a day (BID) | ORAL | 11 refills | Status: DC

## 2018-10-23 MED ORDER — CYCLOBENZAPRINE HCL 10 MG PO TABS: ORAL_TABLET | ORAL | 1 refills | Status: DC

## 2018-10-23 MED ORDER — PREDNISONE 50 MG PO TABS
ORAL_TABLET | ORAL | 0 refills | Status: DC
Start: 2018-10-23 — End: 2019-03-26

## 2018-10-23 MED ORDER — AMLODIPINE BESYLATE 10 MG PO TABS: 10.0000 mg | ORAL_TABLET | Freq: Every day | ORAL | 1 refills | Status: DC

## 2018-10-23 MED ORDER — METOPROLOL SUCCINATE ER 50 MG PO TB24: 50.0000 mg | ORAL_TABLET | Freq: Every day | ORAL | 1 refills | Status: DC

## 2018-10-23 NOTE — Patient Instructions (Addendum)
Low Back Sprain Rehab  Ask your health care provider which exercises are safe for you. Do exercises exactly as told by your health care provider and adjust them as directed. It is normal to feel mild stretching, pulling, tightness, or discomfort as you do these exercises, but you should stop right away if you feel sudden pain or your pain gets worse. Do not begin these exercises until told by your health care provider.  Stretching and range of motion exercises  These exercises warm up your muscles and joints and improve the movement and flexibility of your back. These exercises also help to relieve pain, numbness, and tingling.  Exercise A: Lumbar rotation    1. Lie on your back on a firm surface and bend your knees.  2. Straighten your arms out to your sides so each arm forms an "L" shape with a side of your body (a 90 degree angle).  3. Slowly move both of your knees to one side of your body until you feel a stretch in your lower back. Try not to let your shoulders move off of the floor.  4. Hold for __________ seconds.  5. Tense your abdominal muscles and slowly move your knees back to the starting position.  6. Repeat this exercise on the other side of your body.  Repeat __________ times. Complete this exercise __________ times a day.  Exercise B: Prone extension on elbows    1. Lie on your abdomen on a firm surface.  2. Prop yourself up on your elbows.  3. Use your arms to help lift your chest up until you feel a gentle stretch in your abdomen and your lower back.  ? This will place some of your body weight on your elbows. If this is uncomfortable, try stacking pillows under your chest.  ? Your hips should stay down, against the surface that you are lying on. Keep your hip and back muscles relaxed.  4. Hold for __________ seconds.  5. Slowly relax your upper body and return to the starting position.  Repeat __________ times. Complete this exercise __________ times a day.  Strengthening exercises  These  exercises build strength and endurance in your back. Endurance is the ability to use your muscles for a long time, even after they get tired.  Exercise C: Pelvic tilt  1. Lie on your back on a firm surface. Bend your knees and keep your feet flat.  2. Tense your abdominal muscles. Tip your pelvis up toward the ceiling and flatten your lower back into the floor.  ? To help with this exercise, you may place a small towel under your lower back and try to push your back into the towel.  3. Hold for __________ seconds.  4. Let your muscles relax completely before you repeat this exercise.  Repeat __________ times. Complete this exercise __________ times a day.  Exercise D: Alternating arm and leg raises    1. Get on your hands and knees on a firm surface. If you are on a hard floor, you may want to use padding to cushion your knees, such as an exercise mat.  2. Line up your arms and legs. Your hands should be below your shoulders, and your knees should be below your hips.  3. Lift your left leg behind you. At the same time, raise your right arm and straighten it in front of you.  ? Do not lift your leg higher than your hip.  ? Do not lift your arm   higher than your shoulder.  ? Keep your abdominal and back muscles tight.  ? Keep your hips facing the ground.  ? Do not arch your back.  ? Keep your balance carefully, and do not hold your breath.  4. Hold for __________ seconds.  5. Slowly return to the starting position and repeat with your right leg and your left arm.  Repeat __________ times. Complete this exercise __________ times a day.  Exercise E: Abdominal set with straight leg raise    1. Lie on your back on a firm surface.  2. Bend one of your knees and keep your other leg straight.  3. Tense your abdominal muscles and lift your straight leg up, 4-6 inches (10-15 cm) off the ground.  4. Keep your abdominal muscles tight and hold for __________ seconds.  ? Do not hold your breath.  ? Do not arch your back. Keep it  flat against the ground.  5. Keep your abdominal muscles tense as you slowly lower your leg back to the starting position.  6. Repeat with your other leg.  Repeat __________ times. Complete this exercise __________ times a day.  Posture and body mechanics    Body mechanics refers to the movements and positions of your body while you do your daily activities. Posture is part of body mechanics. Good posture and healthy body mechanics can help to relieve stress in your body's tissues and joints. Good posture means that your spine is in its natural S-curve position (your spine is neutral), your shoulders are pulled back slightly, and your head is not tipped forward. The following are general guidelines for applying improved posture and body mechanics to your everyday activities.  Standing    · When standing, keep your spine neutral and your feet about hip-width apart. Keep a slight bend in your knees. Your ears, shoulders, and hips should line up.  · When you do a task in which you stand in one place for a long time, place one foot up on a stable object that is 2-4 inches (5-10 cm) high, such as a footstool. This helps keep your spine neutral.  Sitting    · When sitting, keep your spine neutral and keep your feet flat on the floor. Use a footrest, if necessary, and keep your thighs parallel to the floor. Avoid rounding your shoulders, and avoid tilting your head forward.  · When working at a desk or a computer, keep your desk at a height where your hands are slightly lower than your elbows. Slide your chair under your desk so you are close enough to maintain good posture.  · When working at a computer, place your monitor at a height where you are looking straight ahead and you do not have to tilt your head forward or downward to look at the screen.  Resting    · When lying down and resting, avoid positions that are most painful for you.  · If you have pain with activities such as sitting, bending, stooping, or squatting  (flexion-based activities), lie in a position in which your body does not bend very much. For example, avoid curling up on your side with your arms and knees near your chest (fetal position).  · If you have pain with activities such as standing for a long time or reaching with your arms (extension-based activities), lie with your spine in a neutral position and bend your knees slightly. Try the following positions:  · Lying on your side with a   pillow between your knees.  · Lying on your back with a pillow under your knees.  Lifting    · When lifting objects, keep your feet at least shoulder-width apart and tighten your abdominal muscles.  · Bend your knees and hips and keep your spine neutral. It is important to lift using the strength of your legs, not your back. Do not lock your knees straight out.  · Always ask for help to lift heavy or awkward objects.  This information is not intended to replace advice given to you by your health care provider. Make sure you discuss any questions you have with your health care provider.  Document Released: 09/27/2005 Document Revised: 06/03/2016 Document Reviewed: 07/09/2015  [employer]vier Interactive Patient Education © 2019 [employer]

## 2018-10-23 NOTE — Progress Notes (Signed)
Subjective:    Patient ID: Juan Hensley, male    DOB: 1971/05/15, 48 y.o.   MRN: [id number]  HPI  Pt is a 48 yo male with HTN who presents to the clinic for medication refill.   Unfortunately he has not had his medication in over a month. He denies any CP, palpitations, headaches. He is not checking his BP at home.   He reports a lot of back pain and recent right knee pain. He has lumbar DDD with multiple disc bulges. He had an epidural injection in 2016 which helped for a few months. He wears a lumbar support brace every day. He can't have sex, walk long distances, sit for long periods of time without intermittent pain and or numbness. Denies any bowel or bladder dysfunction. No saddle anesthesia.   About 2 weeks ago pt used right knee to ger off the concerte floor. It felt "crunchy" and noticed some discomfort. He reinjured it at work. It is swollen.   He would like for me to look at ears. His feels like his right one has something in it. No pain just pressure and discomfort.   .. Active Ambulatory Problems    Diagnosis Date Noted  . Lumbar degenerative disc disease 10/30/2014  . 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 Essential hypertension, benign 01/09/2015  . Tobacco dependence 01/14/2015  . Lumbar radiculopathy 10/31/2018   Resolved Ambulatory Problems    Diagnosis Date Noted  . No Resolved Ambulatory Problems   Past Medical History:  Diagnosis Date  . Hypertension   . Sinus disease         Review of Systems See HPI.     Objective:   Physical Exam Vitals signs reviewed.  Constitutional:      Appearance: Normal appearance.  HENT:     Head: Normocephalic and atraumatic.     Right Ear: Tympanic membrane normal.     Left Ear: Tympanic membrane and ear canal normal.     Ears:     Comments: Right TM had some residual grease that I scraped out.     Mouth/Throat:     Mouth: Mucous membranes are moist.  Cardiovascular:     Rate and Rhythm: Normal rate and regular  rhythm.     Pulses: Normal pulses.     Heart sounds: Normal heart sounds.  Pulmonary:     Effort: Pulmonary effort is normal.     Breath sounds: Normal breath sounds.  Musculoskeletal:     Comments: Right knee effusion. Not warm to touch. Tenderness just behind patella.  creptius noted.  ROM normal.  Negative mcmurrays. Negative anterior drawer.   Neurological:     General: No focal deficit present.     Mental Status: He is alert and oriented to person, place, and time.  Psychiatric:        Mood and Affect: Mood normal.        Behavior: Behavior normal.           Assessment & Plan:  Marland KitchenMarland KitchenTheadore was seen today for follow-up.  Diagnoses and all orders for this visit:  Essential hypertension, benign -     amLODipine (NORVASC) 10 MG tablet; Take 1 tablet (10 mg total) by mouth daily. -     metoprolol succinate (TOPROL-XL) 50 MG 24 hr tablet; Take 1 tablet (50 mg total) by mouth daily.  ETD (Eustachian tube dysfunction), right  Screening for lipid disorders -     Lipid Panel w/reflex Direct LDL  Screening for diabetes mellitus -     COMPLETE METABOLIC PANEL WITH GFR  Acute pain of right knee -     DG Knee 4 Views W/Patella Right  Lumbar degenerative disc disease -     cyclobenzaprine (FLEXERIL) 10 MG tablet; TAKE ONE TABLET BY MOUTH THREE TIMES DAILY AS NEEDED FOR MUSCLE SPASMS. -     naproxen (NAPROSYN) 500 MG tablet; Take 1 tablet (500 mg total) by mouth 2 (two) times daily with a meal. -     predniSONE (DELTASONE) 50 MG tablet; Take one tablet for 5 days.  Lumbar radiculopathy -     cyclobenzaprine (FLEXERIL) 10 MG tablet; TAKE ONE TABLET BY MOUTH THREE TIMES DAILY AS NEEDED FOR MUSCLE SPASMS. -     naproxen (NAPROSYN) 500 MG tablet; Take 1 tablet (500 mg total) by mouth 2 (two) times daily with a meal. -     predniSONE (DELTASONE) 50 MG tablet; Take one tablet for 5 days.   Need a good BP reading. Nurse BP check in 2 weeks on medication.   Pt would likely benefirt  from another epidural injection. Pt is currently self pay. Needs to follow up with Dr. Darene Hensley who ordered the first one. Prednisone burst given today. NSAID refilled. Flexeril as needed. Discussed exercises and other symptomatic care. PT not done because of cost.   Will xray knee. Likely some OA. Consider knee sleeve, ice, ROM exercises.   Grease gotten out of ears today. TM"s look good. Pressure could be some ETD. Try flonase daily.

## 2018-10-26 ENCOUNTER — Ambulatory Visit (INDEPENDENT_AMBULATORY_CARE_PROVIDER_SITE_OTHER): Payer: Self-pay

## 2018-10-26 DIAGNOSIS — M25561 Pain in right knee: Secondary | ICD-10-CM

## 2018-10-30 NOTE — Progress Notes (Signed)
Call pt: no fracture. You do have some arthritis. You do have some loose bodies in knee possible to be causing some discomfort. Right now most of your pain is likely coming from contusion and soft tissue swelling with recent knee injury.

## 2018-10-31 ENCOUNTER — Encounter: Payer: Self-pay | Admitting: Physician Assistant

## 2018-10-31 DIAGNOSIS — M5416 Radiculopathy, lumbar region: Secondary | ICD-10-CM | POA: Insufficient documentation

## 2018-11-01 NOTE — Progress Notes (Signed)
I sent naprosyn prescription. He could try tumeric 500mg  twice a day and/or glucosamine chondroitin which can help with arthritic pain.

## 2019-03-26 ENCOUNTER — Encounter: Payer: Self-pay | Admitting: Physician Assistant

## 2019-03-26 ENCOUNTER — Ambulatory Visit (INDEPENDENT_AMBULATORY_CARE_PROVIDER_SITE_OTHER): Payer: Self-pay | Admitting: Physician Assistant

## 2019-03-26 VITALS — BP 135/82 | HR 73 | Temp 98.4°F | Ht 68.0 in | Wt 219.0 lb

## 2019-03-26 DIAGNOSIS — M6740 Ganglion, unspecified site: Secondary | ICD-10-CM

## 2019-03-26 DIAGNOSIS — R221 Localized swelling, mass and lump, neck: Secondary | ICD-10-CM

## 2019-03-26 DIAGNOSIS — I10 Essential (primary) hypertension: Secondary | ICD-10-CM

## 2019-03-26 DIAGNOSIS — Z131 Encounter for screening for diabetes mellitus: Secondary | ICD-10-CM

## 2019-03-26 DIAGNOSIS — Z13220 Encounter for screening for lipoid disorders: Secondary | ICD-10-CM

## 2019-03-26 MED ORDER — METOPROLOL SUCCINATE ER 50 MG PO TB24: 50.0000 mg | ORAL_TABLET | Freq: Every day | ORAL | 2 refills | Status: DC

## 2019-03-26 MED ORDER — AMLODIPINE BESYLATE 10 MG PO TABS: 10.0000 mg | ORAL_TABLET | Freq: Every day | ORAL | 2 refills | Status: DC

## 2019-03-26 NOTE — Progress Notes (Signed)
Subjective:    Patient ID: Juan Hensley, male    DOB: 1970-11-25, 48 y.o.   MRN: [id number]  Patient presents today with complaints of a new lump under the chin. States he first noticed the mass 3 months ago and since then it has grown. States the mass comes and goes at various sizes but never fully goes away. Says the mass becomes so large at times that it appears as a "double chin." The mass is only painful when touched and the pain does not radiate. Denies any precipitated factors surrounding the initial appearance. Denies any recent illness, fever, cough, sore throat, or sinus pain/pressure. Denies any dental pain or pain in the mouth. The patients wife states he has lost approximately 10 pounds over the last 10 months without trying but they have changed their dietary habits. Patient is concerned as it why the mass appeared and why it randomly enlarges.   Patients also states having a mass on his left hand that has been intermittent over the last 3 months also. Says the mass is not painful but more uncomfortable when present. Denies any alleviating or aggravating factors regarding presentation.     Review of Systems  Constitutional: Positive for unexpected weight change. Negative for activity change, appetite change and fever.  HENT: Negative for congestion, mouth sores, rhinorrhea, sinus pressure, sinus pain, sore throat and trouble swallowing.   Eyes: Negative for visual disturbance.  Respiratory: Negative for cough.   Endocrine: Negative for cold intolerance and heat intolerance.  Musculoskeletal: Negative for neck pain and neck stiffness.  Neurological: Negative for headaches.       Objective:   Physical Exam HENT:     Head: Normocephalic and atraumatic.     Jaw: No tenderness or pain on movement.     Salivary Glands: Right salivary gland is not diffusely enlarged or tender. Left salivary gland is not diffusely enlarged or tender.     Mouth/Throat:     Mouth: Mucous membranes  are moist. No oral lesions.     Dentition: Dental caries present.     Pharynx: Oropharynx is clear. No pharyngeal swelling, oropharyngeal exudate or posterior oropharyngeal erythema.     Tonsils: No tonsillar exudate.  Neck:     Musculoskeletal: Neck supple.     Thyroid: No thyroid mass, thyromegaly or thyroid tenderness.  Musculoskeletal:       Arms:  Lymphadenopathy:     Head:     Right side of head: No submandibular, tonsillar, preauricular, posterior auricular or occipital adenopathy.     Left side of head: Submental adenopathy present. No submandibular, tonsillar, preauricular, posterior auricular or occipital adenopathy.     Cervical: No cervical adenopathy.     Upper Body:     Right upper body: No supraclavicular adenopathy.     Left upper body: No supraclavicular adenopathy.     Comments: A small, pea sized, enlarged submental lymph node noted midline under the patients chin. Slightly painful to light and deep palpation. No lymphadenopathy noted elsewhere.            Assessment & Plan:  Submental lymph node, reactive - A small pea sized submental lymph node palpable upon exam. Ordered a CBC. Discussed lymphadenopathy. Recommended obtaining an ultrasound when the cyst enlarges again. Explained reassuring that the mass comes and goes.   Hypertension - Blood pressure well controlled with current medications. Refilled metoprolol and Norvasc. Continue regimen as prescribed. Ordered CMP and lipid panel to screen for diabetes and lipid  disorders. Will contact patient with results and follow up as needed.   Reassured patient that mass on left dorsal hand is ganglion cyst. Follow up as needed.   Marland KitchenVernetta Honey PA-C, have reviewed and agree with the above documentation in it's entirety.

## 2019-03-26 NOTE — Patient Instructions (Signed)
[place] [place] and [place] on you tube

## 2019-03-27 ENCOUNTER — Encounter: Payer: Self-pay | Admitting: Physician Assistant

## 2019-03-27 DIAGNOSIS — R7301 Impaired fasting glucose: Secondary | ICD-10-CM

## 2019-03-27 DIAGNOSIS — E7800 Pure hypercholesterolemia, unspecified: Secondary | ICD-10-CM | POA: Insufficient documentation

## 2019-03-27 DIAGNOSIS — R221 Localized swelling, mass and lump, neck: Secondary | ICD-10-CM | POA: Insufficient documentation

## 2019-03-27 DIAGNOSIS — M6740 Ganglion, unspecified site: Secondary | ICD-10-CM | POA: Insufficient documentation

## 2019-03-27 HISTORY — DX: Impaired fasting glucose: R73.01

## 2019-03-27 MED ORDER — ATORVASTATIN CALCIUM 40 MG PO TABS: 40.0000 mg | ORAL_TABLET | Freq: Every day | ORAL | 3 refills | Status: DC

## 2019-03-27 NOTE — Progress Notes (Signed)
Call pt: cholesterol is elevated. Your bad cholesterol is 157 and over 10 year CV risk is 16 percent. Over 7.5 percent we suggest starting a cholesterol reducing agent. Can I send to the pharmacy?  Your sugar was elevated as well. Please add a1c to evaluate for diabetes.  CBC looks great.  Kidney and liver look great.

## 2019-03-28 LAB — COMPLETE METABOLIC PANEL WITH GFR
AG Ratio: 1.7 (calc) (ref 1.0–2.5)
ALT: 11 U/L (ref 9–46)
AST: 15 U/L (ref 10–40)
Albumin: 4.3 g/dL (ref 3.6–5.1)
Alkaline phosphatase (APISO): 107 U/L (ref 36–130)
BUN: 16 mg/dL (ref 7–25)
CO2: 22 mmol/L (ref 20–32)
Calcium: 9.1 mg/dL (ref 8.6–10.3)
Chloride: 108 mmol/L (ref 98–110)
Creat: 1.12 mg/dL (ref 0.60–1.35)
GFR, Est African American: 90 mL/min/{1.73_m2} (ref >=60)
GFR, Est Non African American: 77 mL/min/{1.73_m2} (ref >=60)
Globulin: 2.6 g/dL (calc) (ref 1.9–3.7)
Glucose, Bld: 117 mg/dL — ABNORMAL HIGH (ref 65–99)
Potassium: 4.2 mmol/L (ref 3.5–5.3)
Sodium: 139 mmol/L (ref 135–146)
Total Bilirubin: 0.4 mg/dL (ref 0.2–1.2)
Total Protein: 6.9 g/dL (ref 6.1–8.1)

## 2019-03-28 LAB — CBC WITH DIFFERENTIAL/PLATELET
Absolute Monocytes: 541 cells/uL (ref 200–950)
Basophils Absolute: 49 cells/uL (ref 0–200)
Basophils Relative: 0.6 %
Eosinophils Absolute: 180 cells/uL (ref 15–500)
Eosinophils Relative: 2.2 %
HCT: 44.4 % (ref 38.5–50.0)
Hemoglobin: 14.6 g/dL (ref 13.2–17.1)
Lymphs Abs: 2583 cells/uL (ref 850–3900)
MCH: 27.5 pg (ref 27.0–33.0)
MCHC: 32.9 g/dL (ref 32.0–36.0)
MCV: 83.8 fL (ref 80.0–100.0)
MPV: 9.1 fL (ref 7.5–12.5)
Monocytes Relative: 6.6 %
Neutro Abs: 4846 cells/uL (ref 1500–7800)
Neutrophils Relative %: 59.1 %
Platelets: 277 10*3/uL (ref 140–400)
RBC: 5.3 10*6/uL (ref 4.20–5.80)
RDW: 13.6 % (ref 11.0–15.0)
Total Lymphocyte: 31.5 %
WBC: 8.2 10*3/uL (ref 3.8–10.8)

## 2019-03-28 LAB — LIPID PANEL W/REFLEX DIRECT LDL
Cholesterol: 215 mg/dL — ABNORMAL HIGH (ref <200)
HDL: 40 mg/dL (ref >=40)
LDL Cholesterol (Calc): 157 mg/dL (calc) — ABNORMAL HIGH
Non-HDL Cholesterol (Calc): 175 mg/dL (calc) — ABNORMAL HIGH (ref <130)
Total CHOL/HDL Ratio: 5.4 (calc) — ABNORMAL HIGH (ref <5.0)
Triglycerides: 76 mg/dL (ref <150)

## 2019-03-28 LAB — HEMOGLOBIN A1C W/OUT EAG: Hgb A1c MFr Bld: 5.9 % of total Hgb — ABNORMAL HIGH (ref <5.7)

## 2019-05-16 ENCOUNTER — Telehealth: Payer: Self-pay | Admitting: Neurology

## 2019-05-16 ENCOUNTER — Other Ambulatory Visit: Payer: Self-pay | Admitting: Physician Assistant

## 2019-05-16 DIAGNOSIS — M5136 Other intervertebral disc degeneration, lumbar region: Secondary | ICD-10-CM

## 2019-05-16 MED ORDER — PREDNISONE 20 MG PO TABS: ORAL_TABLET | ORAL | 0 refills | Status: DC

## 2019-05-16 NOTE — Progress Notes (Signed)
Exacerbation of chronic low back pain. Needs appt if not improving.

## 2019-05-16 NOTE — Telephone Encounter (Signed)
Patient's wife made aware.

## 2019-05-16 NOTE — Telephone Encounter (Signed)
Patient's wife called to ask for another steroid RX. Patient is having lots of pain. Please advise. [phone].

## 2019-05-16 NOTE — Telephone Encounter (Signed)
Theoretically needs appt for prescriptions to be sent. I went ahead and sent today.

## 2020-01-24 ENCOUNTER — Ambulatory Visit: Payer: Self-pay | Attending: Internal Medicine

## 2020-01-24 DIAGNOSIS — Z23 Encounter for immunization: Secondary | ICD-10-CM

## 2020-01-24 NOTE — Progress Notes (Signed)
   Covid-19 Vaccination [facility]  Name:  Juan Hensley    MRN: [id number] DOB: 11-12-1970  01/24/2020  [name] was observed post Covid-19 immunization for 15 minutes without incident. He was provided with Vaccine Information Sheet and instruction to access the V-Safe system.   [name] was instructed to call 911 with any severe reactions post vaccine: Marland Kitchen Difficulty breathing  . Swelling of face and throat  . A fast heartbeat  . A bad rash all over body  . Dizziness and weakness   Immunizations Administered    Name Date Dose VIS Date Route   Pfizer COVID-19 Vaccine 01/24/2020  8:29 AM 0.3 mL 09/21/2019 Intramuscular   Manufacturer: [employer]   Lot: [id number]   [place]: [id number]

## 2020-01-26 ENCOUNTER — Other Ambulatory Visit: Payer: Self-pay | Admitting: Physician Assistant

## 2020-01-26 DIAGNOSIS — I10 Essential (primary) hypertension: Secondary | ICD-10-CM

## 2020-01-31 ENCOUNTER — Ambulatory Visit: Payer: Self-pay | Admitting: Nurse Practitioner

## 2020-02-01 ENCOUNTER — Ambulatory Visit (INDEPENDENT_AMBULATORY_CARE_PROVIDER_SITE_OTHER): Payer: Self-pay | Admitting: Nurse Practitioner

## 2020-02-01 ENCOUNTER — Other Ambulatory Visit: Payer: Self-pay

## 2020-02-01 ENCOUNTER — Encounter: Payer: Self-pay | Admitting: Nurse Practitioner

## 2020-02-01 VITALS — BP 162/98 | HR 90 | Temp 98.3°F | Ht 68.0 in | Wt 204.4 lb

## 2020-02-01 DIAGNOSIS — Z1211 Encounter for screening for malignant neoplasm of colon: Secondary | ICD-10-CM

## 2020-02-01 DIAGNOSIS — R350 Frequency of micturition: Secondary | ICD-10-CM

## 2020-02-01 DIAGNOSIS — R351 Nocturia: Secondary | ICD-10-CM

## 2020-02-01 DIAGNOSIS — N401 Enlarged prostate with lower urinary tract symptoms: Secondary | ICD-10-CM

## 2020-02-01 DIAGNOSIS — N521 Erectile dysfunction due to diseases classified elsewhere: Secondary | ICD-10-CM

## 2020-02-01 LAB — POCT URINALYSIS DIP (CLINITEK)
Bilirubin, UA: NEGATIVE
Glucose, UA: 500 mg/dL — AB
Leukocytes, UA: NEGATIVE
Nitrite, UA: NEGATIVE
POC PROTEIN,UA: NEGATIVE
Spec Grav, UA: 1.01 (ref 1.010–1.025)
Urobilinogen, UA: 0.2 E.U./dL
pH, UA: 5 (ref 5.0–8.0)

## 2020-02-01 MED ORDER — TADALAFIL 5 MG PO TABS: 5.0000 mg | ORAL_TABLET | Freq: Every day | ORAL | 11 refills | Status: DC

## 2020-02-01 NOTE — Progress Notes (Addendum)
Acute Office Visit  Subjective:    Patient ID: Juan Hensley, male    DOB: March 14, 1971, 49 y.o.   MRN: [id number]  Chief Complaint  Patient presents with  . Urinary Frequency    Onset:68m, urinary frequency, some urgency, has been experiencing ED    Urinary Frequency  Associated symptoms include frequency and urgency. Pertinent negatives include no chills, flank pain, nausea or vomiting.   Patient is in today for urinary symptoms that include increased urinary frequency, nocturia, urinary urgency, stop and go urination, and erectile dysfunction.  He reports his symptoms first started towards the end of last year or beginning of this year.  He also reports increased fatigue.  He denies dysuria, pelvic pain, bowel changes, feelings of incomplete emptying, new sexual partners, back pain, abdominal pain, nausea, vomiting, incontinence, slow stream.  He also denies any new sexual partners, unexplained weight loss, fevers, chills.  He reports he is waking at least every hour in the night to urinate and he does have difficulty waiting to use the restroom.  He did stop taking his Lipitor and stopped the prednisone taper he was taking as he thought both of these could be causing his symptoms.  He would also like a referral for colonoscopy screening today.   Past Medical History:  Diagnosis Date  . Hypertension   . Sinus disease     History reviewed. No pertinent surgical history.  Family History  Problem Relation Age of Onset  . Hypertension Mother   . Hypertension Father     Social History   Socioeconomic History  . Marital status: Single    Spouse name: Not on file  . Number of children: Not on file  . Years of education: Not on file  . Highest education level: Not on file  Occupational History  . Not on file  Tobacco Use  . Smoking status: Current Every Day Smoker    Packs/day: 0.10    Types: Cigarettes  . Smokeless tobacco: Never Used  Substance and Sexual Activity  .  Alcohol use: Yes    Comment: social  . Drug use: Yes    Types: Marijuana  . Sexual activity: Not on file  Other Topics Concern  . Not on file  Social History Narrative  . Not on file   Social Determinants of Health   Financial Resource Strain:   . Difficulty of Paying Living Expenses:   Food Insecurity:   . Worried About Charity fundraiser in the Last Year:   . Arboriculturist in the Last Year:   Transportation Needs:   . Film/video editor (Medical):   Marland Kitchen Lack of Transportation (Non-Medical):   Physical Activity:   . Days of Exercise per Week:   . Minutes of Exercise per Session:   Stress:   . Feeling of Stress :   Social Connections:   . Frequency of Communication with Friends and Family:   . Frequency of Social Gatherings with Friends and Family:   . Attends Religious Services:   . Active Member of Clubs or Organizations:   . Attends Archivist Meetings:   Marland Kitchen Marital Status:   Intimate Partner Violence:   . Fear of Current or Ex-Partner:   . Emotionally Abused:   Marland Kitchen Physically Abused:   . Sexually Abused:     Outpatient Medications Prior to Visit  Medication Sig Dispense Refill  . amLODipine (NORVASC) 10 MG tablet Take 1 tablet (10 mg total) by mouth daily.  Needs appt/labs 30 tablet 0  . atorvastatin (LIPITOR) 40 MG tablet Take 1 tablet (40 mg total) by mouth daily. 90 tablet 3  . cyclobenzaprine (FLEXERIL) 10 MG tablet TAKE ONE TABLET BY MOUTH THREE TIMES DAILY AS NEEDED FOR MUSCLE SPASMS. 30 tablet 1  . metoprolol succinate (TOPROL-XL) 50 MG 24 hr tablet Take 1 tablet (50 mg total) by mouth daily. Needs appt/labs 30 tablet 0  . naproxen (NAPROSYN) 500 MG tablet Take 1 tablet (500 mg total) by mouth 2 (two) times daily with a meal. 60 tablet 11  . predniSONE (DELTASONE) 20 MG tablet Take 3 tablets for 3 days, take 2 tablets for 3 days, take 1 tablet for 3 days, take 1/2 tablet for 4 days. (Patient not taking: Reported on 02/01/2020) 20 tablet 0   No  facility-administered medications prior to visit.    Allergies  Allergen Reactions  . Lisinopril Swelling    Angioedema of lips 02/06/2015  . Penicillins Other (See Comments)    Had reaction as a baby; unaware of what reaction was.    Review of Systems  Constitutional: Positive for fatigue. Negative for appetite change, chills, fever and unexpected weight change.  Respiratory: Negative for cough, chest tightness and shortness of breath.   Cardiovascular: Negative for chest pain, palpitations and leg swelling.  Gastrointestinal: Negative for abdominal distention, blood in stool, constipation, diarrhea, nausea and vomiting.  Genitourinary: Positive for frequency and urgency. Negative for decreased urine volume, discharge, dysuria, flank pain, genital sores, penile pain, penile swelling, scrotal swelling and testicular pain.  Musculoskeletal: Negative for back pain.  Neurological: Negative for dizziness, weakness, light-headedness and headaches.  Psychiatric/Behavioral: Positive for sleep disturbance.       Objective:    Physical Exam Vitals and nursing note reviewed.  Constitutional:      Appearance: Normal appearance. He is normal weight.  HENT:     Head: Normocephalic.  Eyes:     Extraocular Movements: Extraocular movements intact.     Conjunctiva/sclera: Conjunctivae normal.     Pupils: Pupils are equal, round, and reactive to light.  Cardiovascular:     Rate and Rhythm: Normal rate and regular rhythm.     Pulses: Normal pulses.     Heart sounds: Normal heart sounds.  Pulmonary:     Effort: Pulmonary effort is normal.     Breath sounds: Normal breath sounds.  Abdominal:     General: Abdomen is flat. Bowel sounds are normal. There is no distension.     Palpations: Abdomen is soft. There is no mass.     Tenderness: There is no abdominal tenderness. There is no right CVA tenderness, left CVA tenderness, guarding or rebound.     Hernia: No hernia is present.   Musculoskeletal:        General: Normal range of motion.     Cervical back: Normal range of motion.  Skin:    General: Skin is warm and dry.     Capillary Refill: Capillary refill takes less than 2 seconds.  Neurological:     General: No focal deficit present.     Mental Status: He is alert and oriented to person, place, and time.     Motor: No weakness.     Coordination: Coordination normal.     Gait: Gait normal.     Deep Tendon Reflexes: Reflexes normal.  Psychiatric:        Mood and Affect: Mood normal.        Behavior: Behavior normal.  Thought Content: Thought content normal.        Judgment: Judgment normal.    BP (!) 162/98   Pulse 90   Temp 98.3 F (36.8 C) (Oral)   Ht 5\' 8"  (1.727 m)   Wt 204 lb 6.4 oz (92.7 kg)   SpO2 97%   BMI 31.08 kg/m  Wt Readings from Last 3 Encounters:  02/01/20 204 lb 6.4 oz (92.7 kg)  03/26/19 219 lb (99.3 kg)  10/23/18 217 lb (98.4 kg)    There are no preventive care reminders to display for this patient.  There are no preventive care reminders to display for this patient.   No results found for: TSH Lab Results  Component Value Date   WBC 8.2 03/26/2019   HGB 14.6 03/26/2019   HCT 44.4 03/26/2019   MCV 83.8 03/26/2019   PLT 277 03/26/2019   Lab Results  Component Value Date   NA 139 03/26/2019   K 4.2 03/26/2019   CO2 22 03/26/2019   GLUCOSE 117 (H) 03/26/2019   BUN 16 03/26/2019   CREATININE 1.12 03/26/2019   BILITOT 0.4 03/26/2019   AST 15 03/26/2019   ALT 11 03/26/2019   PROT 6.9 03/26/2019   CALCIUM 9.1 03/26/2019   Lab Results  Component Value Date   CHOL 215 (H) 03/26/2019   Lab Results  Component Value Date   HDL 40 03/26/2019   Lab Results  Component Value Date   LDLCALC 157 (H) 03/26/2019   Lab Results  Component Value Date   TRIG 76 03/26/2019   Lab Results  Component Value Date   CHOLHDL 5.4 (H) 03/26/2019   Lab Results  Component Value Date   HGBA1C 5.9 (H) 03/26/2019        Assessment & Plan:   1. Increased urinary frequency Symptoms of increased urinary frequency most notably nocturia.  Urinalysis shows trace intact blood otherwise within normal limits.  Will obtain a urine culture.  Strongly suspicious of BPH so will obtain a PSA to monitor prostate levels.  We will also obtain a BMP to look at kidney function and a CBC to ensure that the patient is does not have anemia.  Plan to start daily tadalafil 5 mg for symptom management. Encourage the patient to restart Lipitor at bedtime for lipid control. We will follow up with patient based on lab results. - Urine Culture - POCT URINALYSIS DIP (CLINITEK) - PSA - CBC with Differential - BASIC METABOLIC PANEL WITH GFR - tadalafil (CIALIS) 5 MG tablet; Take 1 tablet (5 mg total) by mouth daily. Use daily.  Dispense: 30 tablet; Refill: 11  2. Benign prostatic hyperplasia with nocturia Strongly suspect benign prostatic hyperplasia with nocturia due to patient's current symptoms. IPSS evaluation revealed a score of 20, indicating severe symptoms.  Will obtain a PSA today.   Will consider neurology referral based on results of the lab tests. 5 mg daily tadalafil prescribed today to help with symptoms and for concurrent erectile dysfunction.  Discussed the importance of avoiding nitroglycerin and the like medications of taking this medication. Will determine follow-up based on laboratory results. - PSA - CBC with Differential - BASIC METABOLIC PANEL WITH GFR - tadalafil (CIALIS) 5 MG tablet; Take 1 tablet (5 mg total) by mouth daily. Use daily.  Dispense: 30 tablet; Refill: 11  3. 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  Discussed the option of daily medication versus as needed medication  for erectile dysfunction.  A joint decision was made to begin on daily medication to also help with BPH symptoms. Plan to start 5  mg of tadalafil daily. Will determine follow-up based on laboratory results.  Patient  4. Referral for screening colonoscopy provided.   Orma Render, NP

## 2020-02-01 NOTE — Addendum Note (Signed)
Addended by: Sammie Denner, Clarise Cruz E on: 02/01/2020 11:25 AM   Modules accepted: Orders

## 2020-02-01 NOTE — Patient Instructions (Signed)

## 2020-02-02 LAB — BASIC METABOLIC PANEL WITH GFR
BUN: 19 mg/dL (ref 7–25)
CO2: 21 mmol/L (ref 20–32)
Calcium: 9.8 mg/dL (ref 8.6–10.3)
Chloride: 98 mmol/L (ref 98–110)
Creat: 1.08 mg/dL (ref 0.60–1.35)
GFR, Est African American: 93 mL/min/{1.73_m2} (ref >=60)
GFR, Est Non African American: 80 mL/min/{1.73_m2} (ref >=60)
Glucose, Bld: 470 mg/dL — ABNORMAL HIGH (ref 65–99)
Potassium: 4.8 mmol/L (ref 3.5–5.3)
Sodium: 133 mmol/L — ABNORMAL LOW (ref 135–146)

## 2020-02-02 LAB — CBC WITH DIFFERENTIAL/PLATELET
Absolute Monocytes: 312 cells/uL (ref 200–950)
Basophils Absolute: 28 cells/uL (ref 0–200)
Basophils Relative: 0.4 %
Eosinophils Absolute: 71 cells/uL (ref 15–500)
Eosinophils Relative: 1 %
HCT: 48.8 % (ref 38.5–50.0)
Hemoglobin: 16.1 g/dL (ref 13.2–17.1)
Lymphs Abs: 2265 cells/uL (ref 850–3900)
MCH: 27.3 pg (ref 27.0–33.0)
MCHC: 33 g/dL (ref 32.0–36.0)
MCV: 82.9 fL (ref 80.0–100.0)
MPV: 9.6 fL (ref 7.5–12.5)
Monocytes Relative: 4.4 %
Neutro Abs: 4423 cells/uL (ref 1500–7800)
Neutrophils Relative %: 62.3 %
Platelets: 301 10*3/uL (ref 140–400)
RBC: 5.89 10*6/uL — ABNORMAL HIGH (ref 4.20–5.80)
RDW: 12 % (ref 11.0–15.0)
Total Lymphocyte: 31.9 %
WBC: 7.1 10*3/uL (ref 3.8–10.8)

## 2020-02-02 LAB — PSA: PSA: 0.5 ng/mL (ref <=4.0)

## 2020-02-02 LAB — URINE CULTURE
MICRO NUMBER:: 10399859
Result:: NO GROWTH
SPECIMEN QUALITY:: ADEQUATE

## 2020-02-05 ENCOUNTER — Ambulatory Visit (INDEPENDENT_AMBULATORY_CARE_PROVIDER_SITE_OTHER): Payer: Self-pay | Admitting: Nurse Practitioner

## 2020-02-05 ENCOUNTER — Other Ambulatory Visit: Payer: Self-pay

## 2020-02-05 ENCOUNTER — Encounter: Payer: Self-pay | Admitting: Nurse Practitioner

## 2020-02-05 VITALS — BP 150/85 | HR 76 | Ht 68.0 in | Wt 207.0 lb

## 2020-02-05 DIAGNOSIS — R7301 Impaired fasting glucose: Secondary | ICD-10-CM

## 2020-02-05 DIAGNOSIS — I10 Essential (primary) hypertension: Secondary | ICD-10-CM

## 2020-02-05 DIAGNOSIS — E119 Type 2 diabetes mellitus without complications: Secondary | ICD-10-CM | POA: Insufficient documentation

## 2020-02-05 DIAGNOSIS — E1165 Type 2 diabetes mellitus with hyperglycemia: Secondary | ICD-10-CM

## 2020-02-05 LAB — POCT GLYCOSYLATED HEMOGLOBIN (HGB A1C): Hemoglobin A1C: 14 % — AB (ref 4.0–5.6)

## 2020-02-05 MED ORDER — BLOOD GLUCOSE MONITOR KIT: PACK | 99 refills | Status: AC

## 2020-02-05 MED ORDER — METFORMIN HCL 500 MG PO TABS: ORAL_TABLET | ORAL | 3 refills | Status: DC

## 2020-02-05 NOTE — Patient Instructions (Addendum)
Check your blood sugars in the morning when you wake up. Our goal is to get your blood sugars between 80 and 140.   Tips for Eating Away From Home If You Have Diabetes Controlling your blood sugar (glucose) levels can be challenging when you do not prepare your own meals. The following tips can help you manage your diabetes when you eat away from home. If you have questions or if you need help, work with your health care provider or diet and nutrition specialist (dietitian). Planning ahead Plan ahead if you know you will be eating away from home:  Try to eat your meals and snacks at about the same time each day. If you know your meal is going to be later than normal, make sure you have a small snack. Being very hungry can cause you to make unhealthy food choices.  Make a list of restaurants near you that offer healthy choices. If a restaurant has a carry-out menu, take the menu home and plan what you will order ahead of time.  Look up the restaurant you want to eat at online. Many chain and fast-food restaurants list nutritional information online. Use this information to choose the healthiest options and to calculate how many carbohydrates will be in your meal.  Use a carbohydrate-counting book or mobile app to look up the carbohydrate content and serving size of the foods you want to eat. Free foods A "free food" is any food or drink that has less than 5 grams of carbohydrates and less than 20 calories per serving. These food are high in fiber and nutrients and low in calories, carbohydrates, and fats. Free foods include:  Non-starchy vegetables, such as carrots, broccoli, celery, lettuce, or green beans.  Non-sugar drinks, such as water, unsweetened coffee, or unsweetened tea.  Low-calorie salad dressings.  Sugar-free gelatin. Starting meals with a salad full of vegetables is a healthy choice that includes a lot of free foods. Avoid high-calorie salad toppings like bacon, cheese, and  high-fat dressings. Ask for your salad dressing to be served on the side so that you dip your fork in the dressing and then in the salad. This allows you to control how much dressing you eat and still get the flavor with every bite. Choices to control carbohydrates   Ask your server to take away the bread basket or chips from your table.  Choose light yogurt or [place] yogurt instead of non-fat sweetened yogurt.  Order fresh fruit. A salad bar often offers fresh fruit choices. Avoid canned fruit because it is usually packed in sugar or syrup.  Order a salad, and ask for dressing on the side.  Ask for substitutes. For example, if your meal comes with french fries, ask for a side salad or steamed veggies instead. If a meal comes with fried chicken, ask for grilled chicken instead. Beverages  Choose drinks that are low in calories and sugar, such as: ? Water. ? Unsweetened tea or coffee. ? Lowfat milk.  Avoid the following drinks: ? Alcoholic beverages. ? Regular (not diet) sodas. Other tips  If you take insulin, wait to take your insulin once your food arrives to your table. This will ensure that your insulin and your food are timed correctly.  Become familiar with serving sizes and learn to recognize how many servings are in a portion. Restaurant portions are typically two to three times larger than what you really need.  Ask your server for a to-go box at the beginning of the  meal. When your food comes, leave the amount you should have on your plate, and put the rest in the to-go box so that you are not tempted to eat too much.  Consider splitting an entree with someone and ordering a side salad.  Avoid buffets. They are typically too tempting and result in overeating. Where to find more information  American Diabetes Association: www.diabetes.org  American Association of Diabetes Educators: www.diabeteseducator.org Summary  Plan ahead when eating away from home.  Try to eat  your meals and snacks at about the same time each day. If you know your meal is going to be later than normal, make sure you have a small snack. Being very hungry can cause you to make unhealthy food choices.  Ask for substitutes. For example, if your meal comes with french fries, ask for a side salad or steamed veggies instead. If a meal comes with fried chicken, ask for grilled chicken instead.  Ask for a to-go box when you order your meal. Divide your meal before you start eating. This information is not intended to replace advice given to you by your health care provider. Make sure you discuss any questions you have with your health care provider. Document Revised: 01/05/2017 Document Reviewed: 01/05/2017 Elsevier Patient Education  [place].  Diabetes Mellitus and Nutrition, Adult When you have diabetes (diabetes mellitus), it is very important to have healthy eating habits because your blood sugar (glucose) levels are greatly affected by what you eat and drink. Eating healthy foods in the appropriate amounts, at about the same times every day, can help you:  Control your blood glucose.  Lower your risk of heart disease.  Improve your blood pressure.  Reach or maintain a healthy weight. Every person with diabetes is different, and each person has different needs for a meal plan. Your health care provider may recommend that you work with a diet and nutrition specialist (dietitian) to make a meal plan that is best for you. Your meal plan may vary depending on factors such as:  The calories you need.  The medicines you take.  Your weight.  Your blood glucose, blood pressure, and cholesterol levels.  Your activity level.  Other health conditions you have, such as heart or kidney disease. How do carbohydrates affect me? Carbohydrates, also called carbs, affect your blood glucose level more than any other type of food. Eating carbs naturally raises the amount of glucose in  your blood. Carb counting is a method for keeping track of how many carbs you eat. Counting carbs is important to keep your blood glucose at a healthy level, especially if you use insulin or take certain oral diabetes medicines. It is important to know how many carbs you can safely have in each meal. This is different for every person. Your dietitian can help you calculate how many carbs you should have at each meal and for each snack. Foods that contain carbs include:  Bread, cereal, rice, pasta, and crackers.  Potatoes and corn.  Peas, beans, and lentils.  Milk and yogurt.  Fruit and juice.  Desserts, such as cakes, cookies, ice cream, and candy. How does alcohol affect me? Alcohol can cause a sudden decrease in blood glucose (hypoglycemia), especially if you use insulin or take certain oral diabetes medicines. Hypoglycemia can be a life-threatening condition. Symptoms of hypoglycemia (sleepiness, dizziness, and confusion) are similar to symptoms of having too much alcohol. If your health care provider says that alcohol is safe for you, follow  these guidelines:  Limit alcohol intake to no more than 1 drink per day for nonpregnant women and 2 drinks per day for men. One drink equals 12 oz of beer, 5 oz of wine, or 1 oz of hard liquor.  Do not drink on an empty stomach.  Keep yourself hydrated with water, diet soda, or unsweetened iced tea.  Keep in mind that regular soda, juice, and other mixers may contain a lot of sugar and must be counted as carbs. What are tips for following this plan?  Reading food labels  Start by checking the serving size on the "Nutrition Facts" label of packaged foods and drinks. The amount of calories, carbs, fats, and other nutrients listed on the label is based on one serving of the item. Many items contain more than one serving per package.  Check the total grams (g) of carbs in one serving. You can calculate the number of servings of carbs in one  serving by dividing the total carbs by 15. For example, if a food has 30 g of total carbs, it would be equal to 2 servings of carbs.  Check the number of grams (g) of saturated and trans fats in one serving. Choose foods that have low or no amount of these fats.  Check the number of milligrams (mg) of salt (sodium) in one serving. Most people should limit total sodium intake to less than 2,300 mg per day.  Always check the nutrition information of foods labeled as "low-fat" or "nonfat". These foods may be higher in added sugar or refined carbs and should be avoided.  Talk to your dietitian to identify your daily goals for nutrients listed on the label. Shopping  Avoid buying canned, premade, or processed foods. These foods tend to be high in fat, sodium, and added sugar.  Shop around the outside edge of the grocery store. This includes fresh fruits and vegetables, bulk grains, fresh meats, and fresh dairy. Cooking  Use low-heat cooking methods, such as baking, instead of high-heat cooking methods like deep frying.  Cook using healthy oils, such as olive, canola, or sunflower oil.  Avoid cooking with butter, cream, or high-fat meats. Meal planning  Eat meals and snacks regularly, preferably at the same times every day. Avoid going long periods of time without eating.  Eat foods high in fiber, such as fresh fruits, vegetables, beans, and whole grains. Talk to your dietitian about how many servings of carbs you can eat at each meal.  Eat 4-6 ounces (oz) of lean protein each day, such as lean meat, chicken, fish, eggs, or tofu. One oz of lean protein is equal to: ? 1 oz of meat, chicken, or fish. ? 1 egg. ?  cup of tofu.  Eat some foods each day that contain healthy fats, such as avocado, nuts, seeds, and fish. Lifestyle  Check your blood glucose regularly.  Exercise regularly as told by your health care provider. This may include: ? 150 minutes of moderate-intensity or  vigorous-intensity exercise each week. This could be brisk walking, biking, or water aerobics. ? Stretching and doing strength exercises, such as yoga or weightlifting, at least 2 times a week.  Take medicines as told by your health care provider.  Do not use any products that contain nicotine or tobacco, such as cigarettes and e-cigarettes. If you need help quitting, ask your health care provider.  Work with a Social worker or diabetes educator to identify strategies to manage stress and any emotional and social challenges. Questions  to ask a health care provider  Do I need to meet with a diabetes educator?  Do I need to meet with a dietitian?  What number can I call if I have questions?  When are the best times to check my blood glucose? Where to find more information:  American Diabetes Association: diabetes.org  Academy of Nutrition and Dietetics: www.eatright.[employer] of Diabetes and Digestive and Kidney Diseases (NIH): [employer] Summary  A healthy meal plan will help you control your blood glucose and maintain a healthy lifestyle.  Working with a diet and nutrition specialist (dietitian) can help you make a meal plan that is best for you.  Keep in mind that carbohydrates (carbs) and alcohol have immediate effects on your blood glucose levels. It is important to count carbs and to use alcohol carefully. This information is not intended to replace advice given to you by your health care provider. Make sure you discuss any questions you have with your health care provider. Document Revised: 09/09/2017 Document Reviewed: 11/01/2016 Elsevier Patient Education  2020 [place].  Diabetes Mellitus and Exercise Exercising regularly is important for your overall health, especially when you have diabetes (diabetes mellitus). Exercising is not only about losing weight. It has many other health benefits, such as increasing muscle strength and bone density and  reducing body fat and stress. This leads to improved fitness, flexibility, and endurance, all of which result in better overall health. Exercise has additional benefits for people with diabetes, including:  Reducing appetite.  Helping to lower and control blood glucose.  Lowering blood pressure.  Helping to control amounts of fatty substances (lipids) in the blood, such as cholesterol and triglycerides.  Helping the body to respond better to insulin (improving insulin sensitivity).  Reducing how much insulin the body needs.  Decreasing the risk for heart disease by: ? Lowering cholesterol and triglyceride levels. ? Increasing the levels of good cholesterol. ? Lowering blood glucose levels. What is my activity plan? Your health care provider or certified diabetes educator can help you make a plan for the type and frequency of exercise (activity plan) that works for you. Make sure that you:  Do at least 150 minutes of moderate-intensity or vigorous-intensity exercise each week. This could be brisk walking, biking, or water aerobics. ? Do stretching and strength exercises, such as yoga or weightlifting, at least 2 times a week. ? Spread out your activity over at least 3 days of the week.  Get some form of physical activity every day. ? Do not go more than 2 days in a row without some kind of physical activity. ? Avoid being inactive for more than 30 minutes at a time. Take frequent breaks to walk or stretch.  Choose a type of exercise or activity that you enjoy, and set realistic goals.  Start slowly, and gradually increase the intensity of your exercise over time. What do I need to know about managing my diabetes?   Check your blood glucose before and after exercising. ? If your blood glucose is 240 mg/dL (13.3 mmol/L) or higher before you exercise, check your urine for ketones. If you have ketones in your urine, do not exercise until your blood glucose returns to normal. ? If  your blood glucose is 100 mg/dL (5.6 mmol/L) or lower, eat a snack containing 15-20 grams of carbohydrate. Check your blood glucose 15 minutes after the snack to make sure that your level is above 100 mg/dL (5.6 mmol/L) before you start your exercise.  Know the symptoms of low blood glucose (hypoglycemia) and how to treat it. Your risk for hypoglycemia increases during and after exercise. Common symptoms of hypoglycemia can include: ? Hunger. ? Anxiety. ? Sweating and feeling clammy. ? Confusion. ? Dizziness or feeling light-headed. ? Increased heart rate or palpitations. ? Blurry vision. ? Tingling or numbness around the mouth, lips, or tongue. ? Tremors or shakes. ? Irritability.  Keep a rapid-acting carbohydrate snack available before, during, and after exercise to help prevent or treat hypoglycemia.  Avoid injecting insulin into areas of the body that are going to be exercised. For example, avoid injecting insulin into: ? The arms, when playing tennis. ? The legs, when jogging.  Keep records of your exercise habits. Doing this can help you and your health care provider adjust your diabetes management plan as needed. Write down: ? Food that you eat before and after you exercise. ? Blood glucose levels before and after you exercise. ? The type and amount of exercise you have done. ? When your insulin is expected to peak, if you use insulin. Avoid exercising at times when your insulin is peaking.  When you start a new exercise or activity, work with your health care provider to make sure the activity is safe for you, and to adjust your insulin, medicines, or food intake as needed.  Drink plenty of water while you exercise to prevent dehydration or heat stroke. Drink enough fluid to keep your urine clear or pale yellow. Summary  Exercising regularly is important for your overall health, especially when you have diabetes (diabetes mellitus).  Exercising has many health benefits, such  as increasing muscle strength and bone density and reducing body fat and stress.  Your health care provider or certified diabetes educator can help you make a plan for the type and frequency of exercise (activity plan) that works for you.  When you start a new exercise or activity, work with your health care provider to make sure the activity is safe for you, and to adjust your insulin, medicines, or food intake as needed. This information is not intended to replace advice given to you by your health care provider. Make sure you discuss any questions you have with your health care provider. Document Revised: 04/21/2017 Document Reviewed: 03/08/2016 Elsevier Patient Education  [place].   Diabetes Basics  Diabetes (diabetes mellitus) is a long-term (chronic) disease. It occurs when the body does not properly use sugar (glucose) that is released from food after you eat. Diabetes may be caused by one or both of these problems:  Your pancreas does not make enough of a hormone called insulin.  Your body does not react in a normal way to insulin that it makes. Insulin lets sugars (glucose) go into cells in your body. This gives you energy. If you have diabetes, sugars cannot get into cells. This causes high blood sugar (hyperglycemia). Follow these instructions at home: How is diabetes treated? You may need to take insulin or other diabetes medicines daily to keep your blood sugar in balance. Take your diabetes medicines every day as told by your doctor. List your diabetes medicines here: How do I manage my blood sugar?  Check your blood sugar levels using a blood glucose monitor as directed by your doctor. Your doctor will set treatment goals for you. Generally, you should have these blood sugar levels:  Before meals (preprandial): 80-130 mg/dL (4.4-7.2 mmol/L).  After meals (postprandial): below 180 mg/dL (10 mmol/L).  A1c level: less  than 7%. Write down the times that you will  check your blood sugar levels:  What do I need to know about low blood sugar? Low blood sugar is called hypoglycemia. This is when blood sugar is at or below 70 mg/dL (3.9 mmol/L). Symptoms may include:  Feeling: ? Hungry. ? Worried or nervous (anxious). ? Sweaty and clammy. ? Confused. ? Dizzy. ? Sleepy. ? Sick to your stomach (nauseous).  Having: ? A fast heartbeat. ? A headache. ? A change in your vision. ? Tingling or no feeling (numbness) around the mouth, lips, or tongue. ? Jerky movements that you cannot control (seizure).  Having trouble with: ? Moving (coordination). ? Sleeping. ? Passing out (fainting). ? Getting upset easily (irritability). Treating low blood sugar To treat low blood sugar, eat or drink something sugary right away. If you can think clearly and swallow safely, follow the 15:15 rule:  Take 15 grams of a fast-acting carb (carbohydrate). Talk with your doctor about how much you should take.  Some fast-acting carbs are: ? Sugar tablets (glucose pills). Take 3-4 glucose pills. ? 6-8 pieces of hard candy. ? 4-6 oz (120-150 mL) of fruit juice. ? 4-6 oz (120-150 mL) of regular (not diet) soda. ? 1 Tbsp (15 mL) honey or sugar.  Check your blood sugar 15 minutes after you take the carb.  If your blood sugar is still at or below 70 mg/dL (3.9 mmol/L), take 15 grams of a carb again.  If your blood sugar does not go above 70 mg/dL (3.9 mmol/L) after 3 tries, get help right away.  After your blood sugar goes back to normal, eat a meal or a snack within 1 hour. Treating very low blood sugar If your blood sugar is at or below 54 mg/dL (3 mmol/L), you have very low blood sugar (severe hypoglycemia). This is an emergency. Do not wait to see if the symptoms will go away. Get medical help right away. Call your local emergency services (911 in the [place] Do not drive yourself to the hospital. Questions to ask your health care provider  Do I need to meet with a  diabetes educator?  What equipment will I need to care for myself at home?  What diabetes medicines do I need? When should I take them?  How often do I need to check my blood sugar?  What number can I call if I have questions?  When is my next doctor's visit?  Where can I find a support group for people with diabetes? Where to find more information  American Diabetes Association: www.diabetes.org  American Association of Diabetes Educators: www.diabeteseducator.org/patient-resources Contact a doctor if:  Your blood sugar is at or above 240 mg/dL (13.3 mmol/L) for 2 days in a row.  You have been sick or have had a fever for 2 days or more, and you are not getting better.  You have any of these problems for more than 6 hours: ? You cannot eat or drink. ? You feel sick to your stomach (nauseous). ? You throw up (vomit). ? You have watery poop (diarrhea). Get help right away if:  Your blood sugar is lower than 54 mg/dL (3 mmol/L).  You get confused.  You have trouble: ? Thinking clearly. ? Breathing. Summary  Diabetes (diabetes mellitus) is a long-term (chronic) disease. It occurs when the body does not properly use sugar (glucose) that is released from food after digestion.  Take insulin and diabetes medicines as told.  Check your blood sugar  every day, as often as told.  Keep all follow-up visits as told by your doctor. This is important. This information is not intended to replace advice given to you by your health care provider. Make sure you discuss any questions you have with your health care provider. Document Revised: 06/20/2019 Document Reviewed: 12/30/2017 Elsevier Patient Education  [place].

## 2020-02-05 NOTE — Progress Notes (Signed)
Established Patient Office Visit  Subjective:  Patient ID: Juan Hensley, male    DOB: 05-Jul-1971  Age: 49 y.o. MRN: [id number]  CC:  Chief Complaint  Patient presents with  . Diabetes    HPI Juan Hensley is a pleasant 49 year old male presenting with his wife, Juan Hensley,  today for follow-up after elevated blood glucose reading was detected on a recent laboratory exam. Juan Hensley has been experiencing symptoms of increased nocturia, urinary urgency, blurred vision, increased thirst, and erectile dysfunction for the past several weeks. Recent labs revealed blood glucose levels of 470.   They report that they do not have a very active lifestyle and their eating habits are not what they know they should be. Juan Hensley enjoys sweets, fast food, and coka cola. They report he drinks multiple sodas per day.   Past Medical History:  Diagnosis Date  . Hypertension   . Sinus disease     No past surgical history on file.  Family History  Problem Relation Age of Onset  . Hypertension Mother   . Hypertension Father     Social History   Socioeconomic History  . Marital status: Single    Spouse name: Not on file  . Number of children: Not on file  . Years of education: Not on file  . Highest education level: Not on file  Occupational History  . Not on file  Tobacco Use  . Smoking status: Current Every Day Smoker    Packs/day: 0.10    Types: Cigarettes  . Smokeless tobacco: Never Used  Substance and Sexual Activity  . Alcohol use: Yes    Comment: social  . Drug use: Yes    Types: Marijuana  . Sexual activity: Not on file  Other Topics Concern  . Not on file  Social History Narrative  . Not on file   Social Determinants of Health   Financial Resource Strain:   . Difficulty of Paying Living Expenses:   Food Insecurity:   . Worried About Charity fundraiser in the Last Year:   . Arboriculturist in the Last Year:   Transportation Needs:   . Film/video editor (Medical):   Marland Kitchen Lack of  Transportation (Non-Medical):   Physical Activity:   . Days of Exercise per Week:   . Minutes of Exercise per Session:   Stress:   . Feeling of Stress :   Social Connections:   . Frequency of Communication with Friends and Family:   . Frequency of Social Gatherings with Friends and Family:   . Attends Religious Services:   . Active Member of Clubs or Organizations:   . Attends Archivist Meetings:   Marland Kitchen Marital Status:   Intimate Partner Violence:   . Fear of Current or Ex-Partner:   . Emotionally Abused:   Marland Kitchen Physically Abused:   . Sexually Abused:     Outpatient Medications Prior to Visit  Medication Sig Dispense Refill  . amLODipine (NORVASC) 10 MG tablet Take 1 tablet (10 mg total) by mouth daily. Needs appt/labs 30 tablet 0  . atorvastatin (LIPITOR) 40 MG tablet Take 1 tablet (40 mg total) by mouth daily. 90 tablet 3  . cyclobenzaprine (FLEXERIL) 10 MG tablet TAKE ONE TABLET BY MOUTH THREE TIMES DAILY AS NEEDED FOR MUSCLE SPASMS. 30 tablet 1  . metoprolol succinate (TOPROL-XL) 50 MG 24 hr tablet Take 1 tablet (50 mg total) by mouth daily. Needs appt/labs 30 tablet 0  . naproxen (NAPROSYN)  500 MG tablet Take 1 tablet (500 mg total) by mouth 2 (two) times daily with a meal. 60 tablet 11  . tadalafil (CIALIS) 5 MG tablet Take 1 tablet (5 mg total) by mouth daily. Use daily. 30 tablet 11   No facility-administered medications prior to visit.    Allergies  Allergen Reactions  . Lisinopril Swelling    Angioedema of lips 02/06/2015  . Penicillins Other (See Comments)    Had reaction as a baby; unaware of what reaction was.    ROS Review of Systems    Objective:    Physical Exam  BP (!) 150/85   Pulse 76   Ht _0  (1.727 m)   Wt 207 lb (93.9 kg)   SpO2 100%   BMI 31.47 kg/m  Wt Readings from Last 3 Encounters:  02/05/20 207 lb (93.9 kg)  02/01/20 204 lb 6.4 oz (92.7 kg)  03/26/19 219 lb (99.3 kg)     There are no preventive care reminders to display  for this patient.  There are no preventive care reminders to display for this patient.  No results found for: TSH Lab Results  Component Value Date   WBC 7.1 02/01/2020   HGB 16.1 02/01/2020   HCT 48.8 02/01/2020   MCV 82.9 02/01/2020   PLT 301 02/01/2020   Lab Results  Component Value Date   NA 133 (L) 02/01/2020   K 4.8 02/01/2020   CO2 21 02/01/2020   GLUCOSE 470 (H) 02/01/2020   BUN 19 02/01/2020   CREATININE 1.08 02/01/2020   BILITOT 0.4 03/26/2019   AST 15 03/26/2019   ALT 11 03/26/2019   PROT 6.9 03/26/2019   CALCIUM 9.8 02/01/2020   Lab Results  Component Value Date   CHOL 215 (H) 03/26/2019   Lab Results  Component Value Date   HDL 40 03/26/2019   Lab Results  Component Value Date   LDLCALC 157 (H) 03/26/2019   Lab Results  Component Value Date   TRIG 76 03/26/2019   Lab Results  Component Value Date   CHOLHDL 5.4 (H) 03/26/2019   Lab Results  Component Value Date   HGBA1C 5.9 (H) 03/26/2019      Assessment & Plan:   1. Type 2 diabetes mellitus with hyperglycemia, without long-term current use of insulin (HCC) New diagnosis of type 2 diabetes with hyperglycemia.  A1c was greater than 14 today. Patient does have a significant number of factors working against him including dietary choices and lack of exercise.  He also has a significant family history for type 2 diabetes.  Of note his father also experienced kidney failure due to uncontrolled diabetes so we will want to keep a close watch on this. Plan for today is to start Metformin with a slow taper focus on dietary choices to help reduce his carbohydrate intake.  If he can portion of the visit involved education on medication, disease process, and dietary choices that affect blood sugar.  I believe the patient would benefit greatly from diabetic education and he and his wife are receptive to the idea however this may be cost prohibitive. Patient instructed to monitor fasting blood glucose level  once every morning and record blood glucose readings to bring to his next visit. Patient and his wife instructed on the added benefit of daily exercise and encouraged to begin 30-minute walks once a day. Follow-up in 3 to 4 weeks for further education and to monitor the effects of the current medication.  I do  feel that we will likely need to increase his dose of medication and possibly add a second agent however, I do not want to overwhelm him with too many changes at once therefore we will move slow with this. - metFORMIN (GLUCOPHAGE) 500 MG tablet; Start with 1 tab (500 mg) with dinner for 7 days. Increase 1 tab (500 mg) with breakfast and 1 tab (500 mg) with dinner for 7 days. Increase to 1 tab (500 mg) with breakfast and 2 tabs (1000 mg) with dinner.  Dispense: 90 tablet; Refill: 3 - blood glucose meter kit and supplies KIT; Dispense based on patient and insurance preference. Use up to four times daily as directed. Please include lancets, test strips, control solution.  Dispense: 1 each; Refill: PRN - Referral to Nutrition and Diabetes Services  2. Impaired fasting glucose Hemoglobin A1c today greater than 14.  Diagnosis of diabetes established. - POCT glycosylated hemoglobin (Hb A1C)  3. Essential hypertension, benign Blood pressure is elevated in the office today.  This could be to the new diagnosis of type 2 diabetes.  No changes to medication management today.  We will plan to reassess and follow-up at next visit and make any changes as necessary.  Hopeful that improved diet and exercise will remedy this as well.  Return in about 3 weeks (around 02/26/2020) for Diabetes.   Orma Render, NP

## 2020-02-18 ENCOUNTER — Ambulatory Visit: Payer: Self-pay

## 2020-02-18 ENCOUNTER — Ambulatory Visit: Payer: Self-pay | Attending: Internal Medicine

## 2020-02-18 DIAGNOSIS — Z23 Encounter for immunization: Secondary | ICD-10-CM

## 2020-02-18 NOTE — Progress Notes (Signed)
   Covid-19 Vaccination Clinic  Name:  Juan Hensley    MRN: [id number] DOB: 01/27/1971  02/18/2020  [name] was observed post Covid-19 immunization for 15 minutes without incident. He was provided with Vaccine Information Sheet and instruction to access the V-Safe system.   [name] was instructed to call 911 with any severe reactions post vaccine: Marland Kitchen Difficulty breathing  . Swelling of face and throat  . A fast heartbeat  . A bad rash all over body  . Dizziness and weakness   Immunizations Administered    Name Date Dose VIS Date Route   Pfizer COVID-19 Vaccine 02/18/2020  2:14 PM 0.3 mL 12/05/2018 Intramuscular   Manufacturer: [place]   Lot: [id number]   [place]: [id number]

## 2020-02-21 LAB — HM DIABETES EYE EXAM

## 2020-02-27 ENCOUNTER — Other Ambulatory Visit: Payer: Self-pay | Admitting: Physician Assistant

## 2020-02-27 DIAGNOSIS — I10 Essential (primary) hypertension: Secondary | ICD-10-CM

## 2020-03-04 ENCOUNTER — Encounter: Payer: Self-pay | Admitting: Physician Assistant

## 2020-03-06 ENCOUNTER — Other Ambulatory Visit: Payer: Self-pay

## 2020-03-06 ENCOUNTER — Encounter: Payer: Self-pay | Admitting: Nurse Practitioner

## 2020-03-06 ENCOUNTER — Ambulatory Visit (INDEPENDENT_AMBULATORY_CARE_PROVIDER_SITE_OTHER): Payer: Self-pay | Admitting: Nurse Practitioner

## 2020-03-06 VITALS — BP 133/88 | HR 72 | Temp 98.3°F | Ht 68.0 in | Wt 211.2 lb

## 2020-03-06 DIAGNOSIS — M5136 Other intervertebral disc degeneration, lumbar region: Secondary | ICD-10-CM

## 2020-03-06 DIAGNOSIS — M5416 Radiculopathy, lumbar region: Secondary | ICD-10-CM

## 2020-03-06 DIAGNOSIS — E1165 Type 2 diabetes mellitus with hyperglycemia: Secondary | ICD-10-CM

## 2020-03-06 LAB — POCT UA - MICROALBUMIN
Creatinine, POC: 300 mg/dL
Microalbumin Ur, POC: 80 mg/L

## 2020-03-06 MED ORDER — CYCLOBENZAPRINE HCL 10 MG PO TABS: ORAL_TABLET | ORAL | 1 refills | Status: DC

## 2020-03-06 MED ORDER — METFORMIN HCL 500 MG PO TABS: ORAL_TABLET | ORAL | 3 refills | Status: DC

## 2020-03-06 NOTE — Progress Notes (Signed)
Established Patient Office Visit  Subjective:  Patient ID: Juan Hensley, male    DOB: 08/21/1971  Age: 49 y.o. MRN: [id number]  CC:  Chief Complaint  Patient presents with  . Diabetes    HPI Juan Hensley presents for follow-up for diabetes management and care. He was first diagnosed with DM2 in April of this year. Since that time he has been started on Metformin 1563m per day and changed his diet significantly. He reports he has given up soda and sweetened drinks and most carbohydrates. He and his wife have made significant lifestyle changes together. He and his wife will be starting working out in the gym on Monday. He denies any side effects from the metformin. He has seen improvement in his urinary symptoms and ED.   DIABETES Taking medications as prescribed: yes Glucose Monitoring: yes  Accucheck frequency: Daily  Fasting glucose: 98-174 Hypoglycemic episodes:no Polydipsia/polyuria: no Visual changes: yes Chest pain: no Paresthesias: no Blood Pressure Monitoring: not checking Retinal Examination: Up to Date Foot Exam: Up to Date Diabetic Education: Not Completed Pneumovax: Not up to Date Influenza: Up to Date Aspirin: no   Past Medical History:  Diagnosis Date  . Elevated fasting glucose 03/27/2019  . Hypertension   . Sinus disease     History reviewed. No pertinent surgical history.  Family History  Problem Relation Age of Onset  . Hypertension Mother   . Hypertension Father     Social History   Socioeconomic History  . Marital status: Single    Spouse name: Not on file  . Number of children: Not on file  . Years of education: Not on file  . Highest education level: Not on file  Occupational History  . Not on file  Tobacco Use  . Smoking status: Former Smoker    Packs/day: 0.10    Types: Cigarettes    Quit date: 03/11/2018    Years since quitting: 1.9  . Smokeless tobacco: Never Used  Substance and Sexual Activity  . Alcohol use: Not Currently   Comment: social  . Drug use: Not Currently    Types: Marijuana  . Sexual activity: Yes  Other Topics Concern  . Not on file  Social History Narrative  . Not on file   Social Determinants of Health   Financial Resource Strain:   . Difficulty of Paying Living Expenses:   Food Insecurity:   . Worried About RCharity fundraiserin the Last Year:   . RArboriculturistin the Last Year:   Transportation Needs:   . LFilm/video editor(Medical):   .Marland KitchenLack of Transportation (Non-Medical):   Physical Activity:   . Days of Exercise per Week:   . Minutes of Exercise per Session:   Stress:   . Feeling of Stress :   Social Connections:   . Frequency of Communication with Friends and Family:   . Frequency of Social Gatherings with Friends and Family:   . Attends Religious Services:   . Active Member of Clubs or Organizations:   . Attends CArchivistMeetings:   .Marland KitchenMarital Status:   Intimate Partner Violence:   . Fear of Current or Ex-Partner:   . Emotionally Abused:   .Marland KitchenPhysically Abused:   . Sexually Abused:     Outpatient Medications Prior to Visit  Medication Sig Dispense Refill  . amLODipine (NORVASC) 10 MG tablet Take 1 tablet (10 mg total) by mouth daily. 90 tablet 1  . atorvastatin (LIPITOR)  40 MG tablet Take 1 tablet (40 mg total) by mouth daily. 90 tablet 3  . blood glucose meter kit and supplies KIT Dispense based on patient and insurance preference. Use up to four times daily as directed. Please include lancets, test strips, control solution. 1 each PRN  . metoprolol succinate (TOPROL-XL) 50 MG 24 hr tablet Take 1 tablet (50 mg total) by mouth daily. 90 tablet 1  . naproxen (NAPROSYN) 500 MG tablet Take 1 tablet (500 mg total) by mouth 2 (two) times daily with a meal. 60 tablet 11  . tadalafil (CIALIS) 5 MG tablet Take 1 tablet (5 mg total) by mouth daily. Use daily. 30 tablet 11  . cyclobenzaprine (FLEXERIL) 10 MG tablet TAKE ONE TABLET BY MOUTH THREE TIMES DAILY AS  NEEDED FOR MUSCLE SPASMS. 30 tablet 1  . metFORMIN (GLUCOPHAGE) 500 MG tablet Start with 1 tab (500 mg) with dinner for 7 days. Increase 1 tab (500 mg) with breakfast and 1 tab (500 mg) with dinner for 7 days. Increase to 1 tab (500 mg) with breakfast and 2 tabs (1000 mg) with dinner. 90 tablet 3   No facility-administered medications prior to visit.    Allergies  Allergen Reactions  . Lisinopril Swelling    Angioedema of lips 02/06/2015  . Penicillins Other (See Comments)    Had reaction as a baby; unaware of what reaction was.    ROS Review of Systems  Constitutional: Negative for activity change, appetite change, fatigue, fever and unexpected weight change.  Eyes: Negative for visual disturbance.  Respiratory: Negative for cough, chest tightness and shortness of breath.   Cardiovascular: Negative for chest pain, palpitations and leg swelling.  Gastrointestinal: Negative for abdominal distention, abdominal pain, diarrhea, nausea and vomiting.  Endocrine: Negative for polydipsia, polyphagia and polyuria.  Genitourinary: Negative for decreased urine volume, difficulty urinating, dysuria, frequency, hematuria and urgency.  Musculoskeletal: Positive for back pain.  Neurological: Negative for dizziness, tremors, weakness, light-headedness, numbness and headaches.  Psychiatric/Behavioral: Negative for confusion and sleep disturbance.      Objective:    Physical Exam  Constitutional: He is oriented to person, place, and time. He appears well-developed and well-nourished.  HENT:  Head: Normocephalic.  Eyes: Pupils are equal, round, and reactive to light. Conjunctivae and EOM are normal.  Cardiovascular: Normal rate, regular rhythm, normal heart sounds and intact distal pulses.  Pulmonary/Chest: Effort normal and breath sounds normal.  Abdominal: Soft. Bowel sounds are normal.  Musculoskeletal:     Cervical back: Normal range of motion and neck supple.     Lumbar back: Tenderness  present. Decreased range of motion.     Comments: Chronic back pain  Neurological: He is alert and oriented to person, place, and time. He has normal reflexes.  Skin: Skin is warm and dry.  Psychiatric: He has a normal mood and affect. His behavior is normal. Judgment and thought content normal.  Nursing note and vitals reviewed.   BP 133/88   Pulse 72   Temp 98.3 F (36.8 C) (Oral)   Ht _0  (1.727 m)   Wt 211 lb 3.2 oz (95.8 kg)   SpO2 99%   BMI 32.11 kg/m  Wt Readings from Last 3 Encounters:  03/06/20 211 lb 3.2 oz (95.8 kg)  02/05/20 207 lb (93.9 kg)  02/01/20 204 lb 6.4 oz (92.7 kg)     There are no preventive care reminders to display for this patient.  There are no preventive care reminders to display for this  patient.  No results found for: TSH Lab Results  Component Value Date   WBC 7.1 02/01/2020   HGB 16.1 02/01/2020   HCT 48.8 02/01/2020   MCV 82.9 02/01/2020   PLT 301 02/01/2020   Lab Results  Component Value Date   NA 133 (L) 02/01/2020   K 4.8 02/01/2020   CO2 21 02/01/2020   GLUCOSE 470 (H) 02/01/2020   BUN 19 02/01/2020   CREATININE 1.08 02/01/2020   BILITOT 0.4 03/26/2019   AST 15 03/26/2019   ALT 11 03/26/2019   PROT 6.9 03/26/2019   CALCIUM 9.8 02/01/2020   Lab Results  Component Value Date   CHOL 215 (H) 03/26/2019   Lab Results  Component Value Date   HDL 40 03/26/2019   Lab Results  Component Value Date   LDLCALC 157 (H) 03/26/2019   Lab Results  Component Value Date   TRIG 76 03/26/2019   Lab Results  Component Value Date   CHOLHDL 5.4 (H) 03/26/2019   Lab Results  Component Value Date   HGBA1C 14.0 (A) 02/05/2020      Assessment & Plan:   1. Type 2 diabetes mellitus with hyperglycemia, without long-term current use of insulin (HCC) Significant improvement on daily blood glucose levels with dietary changes and medication. Patient is making great strides and is determined to make permanent lifestyle changes. I am  so proud of him for his efforts and success in keeping his blood sugars under control.  Microalbumin does show abnormality today. We will plan to continue to monitor this as time progresses. I am hopeful with improved glycemic control we will see a positive impact on kidney function.   PLAN -Continue daily metformin and diet and lifestyle changes. -Monitor blood sugar levels at least once a day in the morning before meals and any time you have symptoms of low blood sugar. -Educated on importance of restarting statin therapy -Follow-up in 3 months for diabetes management.  -Next visit will check HbA1c and lipids.   - POCT UA - Microalbumin - metFORMIN (GLUCOPHAGE) 500 MG tablet; Take 1 tab (500 mg) with breakfast and 2 tabs (1000 mg) with dinner daily for blood sugar.  Dispense: 270 tablet; Refill: 3  2. Lumbar degenerative disc disease Chronic low back pain requiring PRN flexeril for symptoms. No new changes or concerns.  Refills provided today.  - cyclobenzaprine (FLEXERIL) 10 MG tablet; TAKE ONE TABLET BY MOUTH THREE TIMES DAILY AS NEEDED FOR MUSCLE SPASMS.  Dispense: 90 tablet; Refill: 1  3. Lumbar radiculopathy Chronic low back pain requiring PRN flexeril for symptoms. No new changes or concerns.  Refills provided today.  - cyclobenzaprine (FLEXERIL) 10 MG tablet; TAKE ONE TABLET BY MOUTH THREE TIMES DAILY AS NEEDED FOR MUSCLE SPASMS.  Dispense: 90 tablet; Refill: 1  Orma Render, NP

## 2020-03-06 NOTE — Patient Instructions (Addendum)
Continue to monitor your blood sugar at least once every morning before you eat and write down these numbers. This will help give [place] an idea of how your blood sugar is looking on a daily basis. It can also be an indication of foods that may be causing your blood sugar to spike. If you see a spike in your blood sugar, think back to foods eaten the day before and try to limit this food choice in the future to prevent blood sugar elevation.   Monitor for signs of low blood sugar, these are detailed at the bottom of this information sheet.   We will plan to follow-up in 90 days and at that time will check your hemoglobin A1c again to see how much improvement you have made. We need to monitor this about every 3 months initially until we get good control of your blood sugar and then we can consider monitoring every 6 months.    Hypoglycemia Hypoglycemia is when the sugar (glucose) level in your blood is too low. Signs of low blood sugar may include:  Feeling: ? Hungry. ? Worried or nervous (anxious). ? Sweaty and clammy. ? Confused. ? Dizzy. ? Sleepy. ? Sick to your stomach (nauseous).  Having: ? A fast heartbeat. ? A headache. ? A change in your vision. ? Tingling or no feeling (numbness) around your mouth, lips, or tongue. ? Jerky movements that you cannot control (seizure).  Having trouble with: ? Moving (coordination). ? Sleeping. ? Passing out (fainting). ? Getting upset easily (irritability). Low blood sugar can happen to people who have diabetes and people who do not have diabetes. Low blood sugar can happen quickly, and it can be an emergency. Treating low blood sugar Low blood sugar is often treated by eating or drinking something sugary right away, such as:  Fruit juice, 4-6 oz (120-150 mL).  Regular soda (not diet soda), 4-6 oz (120-150 mL).  Low-fat milk, 4 oz (120 mL).  Several pieces of hard candy.  Sugar or honey, 1 Tbsp (15 mL). Treating low blood sugar if you  have diabetes If you can think clearly and swallow safely, follow the 15:15 rule:  Take 15 grams of a fast-acting carb (carbohydrate). Talk with your doctor about how much you should take.  Always keep a source of fast-acting carb with you, such as: ? Sugar tablets (glucose pills). Take 3-4 pills. ? 6-8 pieces of hard candy. ? 4-6 oz (120-150 mL) of fruit juice. ? 4-6 oz (120-150 mL) of regular (not diet) soda. ? 1 Tbsp (15 mL) honey or sugar.  Check your blood sugar 15 minutes after you take the carb.  If your blood sugar is still at or below 70 mg/dL (3.9 mmol/L), take 15 grams of a carb again.  If your blood sugar does not go above 70 mg/dL (3.9 mmol/L) after 3 tries, get help right away.  After your blood sugar goes back to normal, eat a meal or a snack within 1 hour.  Treating very low blood sugar If your blood sugar is at or below 54 mg/dL (3 mmol/L), you have very low blood sugar (severe hypoglycemia). This may also cause:  Passing out.  Jerky movements you cannot control (seizure).  Losing consciousness (coma). This is an emergency. Do not wait to see if the symptoms will go away. Get medical help right away. Call your local emergency services (911 in the [place] Do not drive yourself to the hospital. If you have very low blood sugar  and you cannot eat or drink, you may need a glucagon shot (injection). A family member or friend should learn how to check your blood sugar and how to give you a glucagon shot. Ask your doctor if you need to have a glucagon shot kit at home. Follow these instructions at home: General instructions  Take over-the-counter and prescription medicines only as told by your doctor.  Stay aware of your blood sugar as told by your doctor.  Limit alcohol intake to no more than 1 drink a day for nonpregnant women and 2 drinks a day for men. One drink equals 12 oz of beer (355 mL), 5 oz of wine (148 mL), or 1 oz of hard liquor (44 mL).  Keep all  follow-up visits as told by your doctor. This is important. If you have diabetes:   Follow your diabetes care plan as told by your doctor. Make sure you: ? Know the signs of low blood sugar. ? Take your medicines as told. ? Follow your exercise and meal plan. ? Eat on time. Do not skip meals. ? Check your blood sugar as often as told by your doctor. Always check it before and after exercise. ? Follow your sick day plan when you cannot eat or drink normally. Make this plan ahead of time with your doctor.  Share your diabetes care plan with: ? Your work or school. ? People you live with.  Check your pee (urine) for ketones: ? When you are sick. ? As told by your doctor.  Carry a card or wear jewelry that says you have diabetes. Contact a doctor if:  You have trouble keeping your blood sugar in your target range.  You have low blood sugar often. Get help right away if:  You still have symptoms after you eat or drink something sugary.  Your blood sugar is at or below 54 mg/dL (3 mmol/L).  You have jerky movements that you cannot control.  You pass out. These symptoms may be an emergency. Do not wait to see if the symptoms will go away. Get medical help right away. Call your local emergency services (911 in the [place] Do not drive yourself to the hospital. Summary  Hypoglycemia happens when the level of sugar (glucose) in your blood is too low.  Low blood sugar can happen to people who have diabetes and people who do not have diabetes. Low blood sugar can happen quickly, and it can be an emergency.  Make sure you know the signs of low blood sugar and know how to treat it.  Always keep a source of sugar (fast-acting carb) with you to treat low blood sugar. This information is not intended to replace advice given to you by your health care provider. Make sure you discuss any questions you have with your health care provider. Document Revised: 01/18/2019 Document Reviewed:  10/31/2015 Elsevier Patient Education  2020 [employer].

## 2020-03-31 ENCOUNTER — Other Ambulatory Visit: Payer: Self-pay | Admitting: Physician Assistant

## 2020-03-31 DIAGNOSIS — E1169 Type 2 diabetes mellitus with other specified complication: Secondary | ICD-10-CM

## 2020-04-16 ENCOUNTER — Other Ambulatory Visit: Payer: Self-pay | Admitting: Physician Assistant

## 2020-04-16 DIAGNOSIS — E785 Hyperlipidemia, unspecified: Secondary | ICD-10-CM

## 2020-04-16 DIAGNOSIS — M5136 Other intervertebral disc degeneration, lumbar region: Secondary | ICD-10-CM

## 2020-04-16 DIAGNOSIS — M5416 Radiculopathy, lumbar region: Secondary | ICD-10-CM

## 2020-06-06 ENCOUNTER — Ambulatory Visit: Payer: Self-pay | Admitting: Physician Assistant

## 2020-06-09 ENCOUNTER — Encounter: Payer: Self-pay | Admitting: Nurse Practitioner

## 2020-06-12 ENCOUNTER — Other Ambulatory Visit: Payer: Self-pay

## 2020-06-12 ENCOUNTER — Ambulatory Visit: Payer: Self-pay

## 2020-06-12 ENCOUNTER — Emergency Department
Admission: EM | Admit: 2020-06-12 | Discharge: 2020-06-12 | Disposition: A | Discharge status: Home / Self Care | Payer: Self-pay | Source: Home / Self Care

## 2020-06-12 ENCOUNTER — Ambulatory Visit: Payer: Self-pay | Admitting: Podiatry

## 2020-06-12 DIAGNOSIS — L03032 Cellulitis of left toe: Secondary | ICD-10-CM

## 2020-06-12 DIAGNOSIS — L600 Ingrowing nail: Secondary | ICD-10-CM

## 2020-06-12 MED ORDER — CEPHALEXIN 500 MG PO CAPS: 500.0000 mg | ORAL_CAPSULE | Freq: Two times a day (BID) | ORAL | 0 refills | Status: DC

## 2020-06-12 MED ORDER — MUPIROCIN 2 % EX OINT: TOPICAL_OINTMENT | CUTANEOUS | 0 refills | Status: DC

## 2020-06-12 NOTE — ED Triage Notes (Signed)
Patient presents to Urgent Care with complaints of possible ingrown toenail since about a week ago. Patient reports he has been cutting at it with clippers, nail is darkened at the tip. Pt is diabetic.

## 2020-06-12 NOTE — ED Provider Notes (Signed)
[facility]    CSN: [id number] Arrival date & time: 06/12/20  1305      History   Chief Complaint Chief Complaint  Patient presents with  . Toe Pain    HPI Juan Hensley is a 49 y.o. male.   HPI Juan Hensley is a 49 y.o. male presenting to UC with c/o 1 week gradually worsening Left great toe pain from an ingrown toenail that he has been trying to cut back with clippers without success. He has noticed darkened area at tip of nail, pain and some swelling.  Denies fever, chills, n/v/d. He is diabetic but denies neuropathy.   Past Medical History:  Diagnosis Date  . Elevated fasting glucose 03/27/2019  . Hypertension   . Sinus disease     Patient Active Problem List   Diagnosis Date Noted  . Diabetes mellitus without complication ([place]) 86/38/1771  . Elevated LDL cholesterol level 03/27/2019  . 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 Ganglion cyst 03/27/2019  . Lumbar radiculopathy 10/31/2018  . Tobacco dependence 01/14/2015  . Essential hypertension, benign 01/09/2015  . 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 Lumbar degenerative disc disease 10/30/2014    History reviewed. No pertinent surgical history.     Home Medications    Prior to Admission medications   Medication Sig Start Date End Date Taking? Authorizing Provider  amLODipine (NORVASC) 10 MG tablet Take 1 tablet (10 mg total) by mouth daily. 02/28/20   Breeback, Jade L, PA-C  atorvastatin (LIPITOR) 40 MG tablet Take 1 tablet (40 mg total) by mouth daily. Needs labs 03/31/20   Iran Planas L, PA-C  blood glucose meter kit and supplies KIT Dispense based on patient and insurance preference. Use up to four times daily as directed. Please include lancets, test strips, control solution. 02/05/20   Orma Render, NP  cephALEXin (KEFLEX) 500 MG capsule Take 1 capsule (500 mg total) by mouth 2 (two) times daily. 06/12/20   Noe Gens, PA-C  cyclobenzaprine (FLEXERIL) 10 MG tablet TAKE ONE TABLET BY MOUTH THREE  TIMES DAILY AS NEEDED FOR MUSCLE SPASMS. 03/06/20   Orma Render, NP  metFORMIN (GLUCOPHAGE) 500 MG tablet Take 1 tab (500 mg) with breakfast and 2 tabs (1000 mg) with dinner daily for blood sugar. 03/06/20   Orma Render, NP  metoprolol succinate (TOPROL-XL) 50 MG 24 hr tablet Take 1 tablet (50 mg total) by mouth daily. 02/28/20   Donella Stade, PA-C  mupirocin ointment (BACTROBAN) 2 % Apply to wound 3 times daily for 5 days 06/12/20   Noe Gens, PA-C  naproxen (NAPROSYN) 500 MG tablet TAKE 1 TABLET BY MOUTH TWICE DAILY WITH A MEAL 04/17/20   Breeback, Jade L, PA-C  tadalafil (CIALIS) 5 MG tablet Take 1 tablet (5 mg total) by mouth daily. Use daily. 02/01/20   Orma Render, NP    Family History Family History  Problem Relation Age of Onset  . Hypertension Mother   . Diabetes Mother   . Healthy Father     Social History Social History   Tobacco Use  . Smoking status: Former Smoker    Packs/day: 0.10    Types: Cigarettes    Quit date: 03/11/2018    Years since quitting: 2.2  . Smokeless tobacco: Never Used  Substance Use Topics  . Alcohol use: Not Currently    Comment: social  . Drug use: Not Currently    Types: Marijuana     Allergies  Lisinopril and Penicillins   Review of Systems Review of Systems  Constitutional: Negative for chills and fever.  Skin: Positive for color change and wound.     Physical Exam Triage Vital Signs ED Triage Vitals  Enc Vitals Group     BP 06/12/20 1335 140/90     Pulse Rate 06/12/20 1335 73     Resp 06/12/20 1335 16     Temp 06/12/20 1335 98.3 F (36.8 C)     Temp Source 06/12/20 1335 Oral     SpO2 06/12/20 1335 99 %     Weight --      Height --      Head Circumference --      Peak Flow --      Pain Score 06/12/20 1333 9     Pain Loc --      Pain Edu? --      Excl. in [address]? --    No data found.  Updated Vital Signs BP 140/90 (BP Location: Left Arm)   Pulse 73   Temp 98.3 F (36.8 C) (Oral)   Resp 16   SpO2 99%    Visual Acuity Right Eye Distance:   Left Eye Distance:   Bilateral Distance:    Right Eye Near:   Left Eye Near:    Bilateral Near:     Physical Exam Vitals and nursing note reviewed.  Constitutional:      Appearance: Normal appearance. He is well-developed.  HENT:     Head: Normocephalic and atraumatic.  Cardiovascular:     Rate and Rhythm: Normal rate.  Pulmonary:     Effort: Pulmonary effort is normal.  Musculoskeletal:        General: Normal range of motion.     Cervical back: Normal range of motion.  Skin:    General: Skin is warm and dry.     Capillary Refill: Capillary refill takes less than 2 seconds.     Comments: Left great toe: ingrown nail medial aspect, erythema, mild edema, tenderness. No active drainage.   Neurological:     Mental Status: He is alert and oriented to person, place, and time.     Sensory: No sensory deficit.  Psychiatric:        Behavior: Behavior normal.      UC Treatments / Results  Labs (all labs ordered are listed, but only abnormal results are displayed) Labs Reviewed - No data to display  EKG   Radiology No results found.  Procedures Procedures (including critical care time)  Medications Ordered in UC Medications - No data to display  Initial Impression / Assessment and Plan / UC Course  I have reviewed the triage vital signs and the nursing notes.  Pertinent labs & imaging results that were available during my care of the patient were reviewed by me and considered in my medical decision making (see chart for details).     Hx and exam c/w infected ingrown nail Will tx with oral and topical antibiotics F/u with PCP or Podiatry AVS given  Final Clinical Impressions(s) / UC Diagnoses   Final diagnoses:  Ingrown nail of great toe of left foot  Paronychia of great toe of left foot     Discharge Instructions      You can soak your foot in warm water and Epsom salt for 10-15 minutes at a time, gently massage  skin away from the nail, allow the nail to grow out. You can apply the antibiotic ointment as prescribed  as well as taking the oral antibiotic.  Call to schedule a follow up appointment with Primary Care or a podiatrist for further evaluation and treatment.  Dr. Jacqualyn Posey is at [facility] on most Fridays.     ED Prescriptions    Medication Sig Dispense Auth. Provider   mupirocin ointment (BACTROBAN) 2 % Apply to wound 3 times daily for 5 days 22 g Natale Thoma O, PA-C   cephALEXin (KEFLEX) 500 MG capsule Take 1 capsule (500 mg total) by mouth 2 (two) times daily. 14 capsule Noe Gens, [place]     PDMP not reviewed this encounter.   Noe Gens, [place] 06/12/20 1450

## 2020-06-12 NOTE — Discharge Instructions (Signed)
°  You can soak your foot in warm water and Epsom salt for 10-15 minutes at a time, gently massage skin away from the nail, allow the nail to grow out. You can apply the antibiotic ointment as prescribed as well as taking the oral antibiotic.  Call to schedule a follow up appointment with Primary Care or a podiatrist for further evaluation and treatment.  Dr. Jacqualyn Posey is at [facility] on most Fridays.

## 2020-06-13 ENCOUNTER — Encounter: Payer: Self-pay | Admitting: Podiatry

## 2020-06-13 ENCOUNTER — Ambulatory Visit (INDEPENDENT_AMBULATORY_CARE_PROVIDER_SITE_OTHER): Payer: Self-pay | Admitting: Podiatry

## 2020-06-13 VITALS — BP 134/89 | HR 79 | Temp 97.3°F | Resp 16

## 2020-06-13 DIAGNOSIS — L600 Ingrowing nail: Secondary | ICD-10-CM

## 2020-06-13 DIAGNOSIS — E1165 Type 2 diabetes mellitus with hyperglycemia: Secondary | ICD-10-CM

## 2020-06-13 DIAGNOSIS — M79675 Pain in left toe(s): Secondary | ICD-10-CM

## 2020-06-13 MED ORDER — DOXYCYCLINE HYCLATE 100 MG PO TABS: 100.0000 mg | ORAL_TABLET | Freq: Two times a day (BID) | ORAL | 0 refills | Status: DC

## 2020-06-13 NOTE — Patient Instructions (Signed)
Soak Instructions    THE DAY AFTER THE PROCEDURE  Place 1/4 cup of epsom salts in a quart of warm tap water.  Submerge your foot or feet with outer bandage intact for the initial soak; this will allow the bandage to become moist and wet for easy lift off.  Once you remove your bandage, continue to soak in the solution for 20 minutes.  This soak should be done twice a day.  Next, remove your foot or feet from solution, blot dry the affected area and cover.  You may use a band aid large enough to cover the area or use gauze and tape.  Apply other medications to the area as directed by the doctor such as polysporin neosporin.  IF YOUR SKIN BECOMES IRRITATED WHILE USING THESE INSTRUCTIONS, IT IS OKAY TO SWITCH TO  [name] VINEGAR AND WATER. Or you may use antibacterial soap and water to keep the toe clean  Monitor for any signs/symptoms of infection. Call the office immediately if any occur or go directly to the emergency room. Call with any questions/concerns.    Long Term Care Instructions-Post Nail Surgery  You have had your ingrown toenail and root treated with a chemical.  This chemical causes a burn that will drain and ooze like a blister.  This can drain for 6-8 weeks or longer.  It is important to keep this area clean, covered, and follow the soaking instructions dispensed at the time of your surgery.  This area will eventually dry and form a scab.  Once the scab forms you no longer need to soak or apply a dressing.  If at any time you experience an increase in pain, redness, swelling, or drainage, you should contact the office as soon as possible.  

## 2020-06-13 NOTE — Progress Notes (Signed)
Subjective:   Patient ID: Juan Hensley, male   DOB: 49 y.o.   MRN: [id number]   HPI 49 year old male presents the office today for concerns of ingrown toenail which is been ongoing last 1 week to the left big toe, medial aspect.  He states he went to urgent care was given Keflex but he states he is allergic to this and does not want to take it.  He has been applying mupirocin.  He has tried trim the nail himself.  Area is tender.  No red streaks but occasional drainage.  A1c was 14 this was a new diagnosis of diabetes.  He states his blood sugars have been running between the 90s to low 100s.   Review of Systems  All other systems reviewed and are negative.   Past Medical History:  Diagnosis Date  . Elevated fasting glucose 03/27/2019  . Hypertension   . Sinus disease     History reviewed. No pertinent surgical history.   Current Outpatient Medications:  .  amLODipine (NORVASC) 10 MG tablet, Take 1 tablet (10 mg total) by mouth daily., Disp: 90 tablet, Rfl: 1 .  atorvastatin (LIPITOR) 40 MG tablet, Take 1 tablet (40 mg total) by mouth daily. Needs labs, Disp: 90 tablet, Rfl: 0 .  blood glucose meter kit and supplies KIT, Dispense based on patient and insurance preference. Use up to four times daily as directed. Please include lancets, test strips, control solution., Disp: 1 each, Rfl: PRN .  cyclobenzaprine (FLEXERIL) 10 MG tablet, TAKE ONE TABLET BY MOUTH THREE TIMES DAILY AS NEEDED FOR MUSCLE SPASMS., Disp: 90 tablet, Rfl: 1 .  metFORMIN (GLUCOPHAGE) 500 MG tablet, Take 1 tab (500 mg) with breakfast and 2 tabs (1000 mg) with dinner daily for blood sugar., Disp: 270 tablet, Rfl: 3 .  metoprolol succinate (TOPROL-XL) 50 MG 24 hr tablet, Take 1 tablet (50 mg total) by mouth daily., Disp: 90 tablet, Rfl: 1 .  mupirocin ointment (BACTROBAN) 2 %, Apply to wound 3 times daily for 5 days, Disp: 22 g, Rfl: 0 .  naproxen (NAPROSYN) 500 MG tablet, TAKE 1 TABLET BY MOUTH TWICE DAILY WITH A MEAL,  Disp: 60 tablet, Rfl: 0 .  tadalafil (CIALIS) 5 MG tablet, Take 1 tablet (5 mg total) by mouth daily. Use daily., Disp: 30 tablet, Rfl: 11 .  doxycycline (VIBRA-TABS) 100 MG tablet, Take 1 tablet (100 mg total) by mouth 2 (two) times daily., Disp: 14 tablet, Rfl: 0  Allergies  Allergen Reactions  . Lisinopril Swelling    Angioedema of lips 02/06/2015  . Penicillins Other (See Comments)    Had reaction as a baby; unaware of what reaction was.  . Cephalexin Swelling        Objective:  Physical Exam  General: AAO x3, NAD  Dermatological: Incurvation present to the medial aspect left hallux toenail there is granulation tissue present within the nail fold and there is edema and localized erythema but there is no ascending cellulitis.  There is no fluctuation crepitation.  No other open lesions are identified at this time.  Dry skin is evident.  Vascular: Dorsalis Pedis artery and Posterior Tibial artery pedal pulses are 2/4 bilateral with immedate capillary fill time. There is no pain with calf compression, swelling, warmth, erythema.   Neruologic: Grossly intact via light touch bilateral.  Sensation intact with Semmes Weinstein monofilament  Musculoskeletal: Tenderness in the medial left hallux nail fold.  Muscular strength 5/5 in all groups tested bilateral.  Gait: Unassisted,  Nonantalgic.       Assessment:   Ingrown toenail left hallux    Plan:  -Treatment options discussed including all alternatives, risks, and complications -Etiology of symptoms were discussed -At this time, recommended partial nail removal without chemical matricectomy to the medial due to infection. Risks and complications were discussed with the patient for which they understand and  verbally consent to the procedure. Under sterile conditions a total of 3 mL of a mixture of 2% lidocaine plain and 0.5% Marcaine plain was infiltrated in a hallux block fashion. Once anesthetized, the skin was prepped in sterile  fashion. A tourniquet was then applied. Next the medial border of the hallux nail border was sharply excised making sure to remove the entire offending nail border. Once the nail was  Removed, the area was debrided and the underlying skin was intact. The area was irrigated and hemostasis was obtained.  A dry sterile dressing was applied. After application of the dressing the tourniquet was removed and there is found to be an immediate capillary refill time to the digit. The patient tolerated the procedure well any complications. Post procedure instructions were discussed the patient for which he verbally understood. Follow-up in one week for nail check or sooner if any problems are to arise. Discussed signs/symptoms of worsening infection and directed to call the office immediately should any occur or go directly to the emergency room. In the meantime, encouraged to call the office with any questions, concerns, changes symptoms. -Doxycyline  Return in about 2 weeks (around 06/27/2020).  Trula Slade DPM

## 2020-06-27 ENCOUNTER — Other Ambulatory Visit: Payer: Self-pay

## 2020-06-27 ENCOUNTER — Ambulatory Visit: Payer: Self-pay | Admitting: Podiatry

## 2020-06-27 DIAGNOSIS — E1165 Type 2 diabetes mellitus with hyperglycemia: Secondary | ICD-10-CM

## 2020-06-27 DIAGNOSIS — M79675 Pain in left toe(s): Secondary | ICD-10-CM

## 2020-06-27 DIAGNOSIS — L600 Ingrowing nail: Secondary | ICD-10-CM

## 2020-06-27 NOTE — Patient Instructions (Signed)
Diabetes Mellitus and Foot Care Foot care is an important part of your health, especially when you have diabetes. Diabetes may cause you to have problems because of poor blood flow (circulation) to your feet and legs, which can cause your skin to:  Become thinner and drier.  Break more easily.  Heal more slowly.  Peel and crack. You may also have nerve damage (neuropathy) in your legs and feet, causing decreased feeling in them. This means that you may not notice minor injuries to your feet that could lead to more serious problems. Noticing and addressing any potential problems early is the best way to prevent future foot problems. How to care for your feet Foot hygiene  Wash your feet daily with warm water and mild soap. Do not use hot water. Then, pat your feet and the areas between your toes until they are completely dry. Do not soak your feet as this can dry your skin.  Trim your toenails straight across. Do not dig under them or around the cuticle. File the edges of your nails with an emery board or nail file.  Apply a moisturizing lotion or petroleum jelly to the skin on your feet and to dry, brittle toenails. Use lotion that does not contain alcohol and is unscented. Do not apply lotion between your toes. Shoes and socks  Wear clean socks or stockings every day. Make sure they are not too tight. Do not wear knee-high stockings since they may decrease blood flow to your legs.  Wear shoes that fit properly and have enough cushioning. Always look in your shoes before you put them on to be sure there are no objects inside.  To break in new shoes, wear them for just a few hours a day. This prevents injuries on your feet. Wounds, scrapes, corns, and calluses  Check your feet daily for blisters, cuts, bruises, sores, and redness. If you cannot see the bottom of your feet, use a mirror or ask someone for help.  Do not cut corns or calluses or try to remove them with medicine.  If you  find a minor scrape, cut, or break in the skin on your feet, keep it and the skin around it clean and dry. You may clean these areas with mild soap and water. Do not clean the area with peroxide, alcohol, or iodine.  If you have a wound, scrape, corn, or callus on your foot, look at it several times a day to make sure it is healing and not infected. Check for: ? Redness, swelling, or pain. ? Fluid or blood. ? Warmth. ? Pus or a bad smell. General instructions  Do not cross your legs. This may decrease blood flow to your feet.  Do not use heating pads or hot water bottles on your feet. They may burn your skin. If you have lost feeling in your feet or legs, you may not know this is happening until it is too late.  Protect your feet from hot and cold by wearing shoes, such as at the beach or on hot pavement.  Schedule a complete foot exam at least once a year (annually) or more often if you have foot problems. If you have foot problems, report any cuts, sores, or bruises to your health care provider immediately. Contact a health care provider if:  You have a medical condition that increases your risk of infection and you have any cuts, sores, or bruises on your feet.  You have an injury that is not   healing.  You have redness on your legs or feet.  You feel burning or tingling in your legs or feet.  You have pain or cramps in your legs and feet.  Your legs or feet are numb.  Your feet always feel cold.  You have pain around a toenail. Get help right away if:  You have a wound, scrape, corn, or callus on your foot and: ? You have pain, swelling, or redness that gets worse. ? You have fluid or blood coming from the wound, scrape, corn, or callus. ? Your wound, scrape, corn, or callus feels warm to the touch. ? You have pus or a bad smell coming from the wound, scrape, corn, or callus. ? You have a fever. ? You have a red line going up your leg. Summary  Check your feet every day  for cuts, sores, red spots, swelling, and blisters.  Moisturize feet and legs daily.  Wear shoes that fit properly and have enough cushioning.  If you have foot problems, report any cuts, sores, or bruises to your health care provider immediately.  Schedule a complete foot exam at least once a year (annually) or more often if you have foot problems. This information is not intended to replace advice given to you by your health care provider. Make sure you discuss any questions you have with your health care provider. Document Revised: 06/20/2019 Document Reviewed: 10/29/2016 Elsevier Patient Education  2020 [employer] Inc[employer]  

## 2020-06-27 NOTE — Progress Notes (Signed)
Subjective: Juan Hensley is a 49 y.o.  male returns to office today for follow up evaluation after having left Hallux patial  nail avulsion performed. Patient has been soaking using epsom satls and applying topical antibiotic covered with bandaid daily. He has been doing well now any concerns. Patient denies fevers, chills, nausea, vomiting. Denies any calf pain, chest pain, SOB.   Objective:  Vitals: Reviewed  General: Well developed, nourished, in no acute distress, alert and oriented x3   Dermatology: Skin is warm, dry and supple bilateral. Left hallux nail border appears to be clean, dry, with mild granular tissue and surrounding scab. There is no surrounding erythema, edema, drainage/purulence. The remaining nails appear unremarkable at this time. There are no other lesions or other signs of infection present.  Neurovascular status: Intact. No lower extremity swelling; No pain with calf compression bilateral.  Musculoskeletal: No tenderness to palpation of the left hallux nail fold. Muscular strength within normal limits bilateral.   Assesement and Plan: S/p partial nail avulsion, doing well.   -Continue soaking in epsom salts twice a day followed by antibiotic ointment and a band-aid. Can leave uncovered at night. Continue this until completely healed.  -If the area has not healed in 2 weeks, call the office for follow-up appointment, or sooner if any problems arise.  -Discussed daily foot inspection. I did follow-up with him in about 6 months for diabetic foot exam. -Monitor for any signs/symptoms of infection. Call the office immediately if any occur or go directly to the emergency room. Call with any questions/concerns.  Celesta Gentile, DPM

## 2020-06-30 ENCOUNTER — Telehealth: Payer: Self-pay

## 2020-06-30 NOTE — Telephone Encounter (Signed)
Pt called with F/u questions on foot.

## 2020-07-01 NOTE — Telephone Encounter (Signed)
Attempted to call patient, left VM for patient to call back.

## 2020-07-09 ENCOUNTER — Other Ambulatory Visit: Payer: Self-pay | Admitting: Physician Assistant

## 2020-07-09 DIAGNOSIS — E785 Hyperlipidemia, unspecified: Secondary | ICD-10-CM

## 2020-09-02 ENCOUNTER — Other Ambulatory Visit: Payer: Self-pay | Admitting: Physician Assistant

## 2020-09-02 DIAGNOSIS — E1169 Type 2 diabetes mellitus with other specified complication: Secondary | ICD-10-CM

## 2020-09-02 DIAGNOSIS — I10 Essential (primary) hypertension: Secondary | ICD-10-CM

## 2020-10-06 ENCOUNTER — Other Ambulatory Visit: Payer: Self-pay | Admitting: Physician Assistant

## 2020-10-06 DIAGNOSIS — E1169 Type 2 diabetes mellitus with other specified complication: Secondary | ICD-10-CM

## 2020-10-06 DIAGNOSIS — I10 Essential (primary) hypertension: Secondary | ICD-10-CM

## 2020-10-31 ENCOUNTER — Telehealth: Payer: Self-pay | Admitting: Neurology

## 2020-10-31 DIAGNOSIS — I10 Essential (primary) hypertension: Secondary | ICD-10-CM

## 2020-10-31 MED ORDER — METOPROLOL SUCCINATE ER 50 MG PO TB24: ORAL_TABLET | ORAL | 0 refills | Status: DC

## 2020-10-31 MED ORDER — AMLODIPINE BESYLATE 10 MG PO TABS: ORAL_TABLET | ORAL | 0 refills | Status: DC

## 2020-10-31 NOTE — Telephone Encounter (Signed)
Patient called for blood pressure refills until appt on Monday.  Sent refills. Patient made aware.

## 2020-11-03 ENCOUNTER — Encounter: Payer: Self-pay | Admitting: Physician Assistant

## 2020-11-03 ENCOUNTER — Ambulatory Visit (INDEPENDENT_AMBULATORY_CARE_PROVIDER_SITE_OTHER): Payer: Self-pay | Admitting: Physician Assistant

## 2020-11-03 ENCOUNTER — Other Ambulatory Visit: Payer: Self-pay

## 2020-11-03 VITALS — BP 144/90 | HR 80 | Ht 68.0 in | Wt 230.0 lb

## 2020-11-03 DIAGNOSIS — R350 Frequency of micturition: Secondary | ICD-10-CM

## 2020-11-03 DIAGNOSIS — Z1211 Encounter for screening for malignant neoplasm of colon: Secondary | ICD-10-CM

## 2020-11-03 DIAGNOSIS — M5416 Radiculopathy, lumbar region: Secondary | ICD-10-CM

## 2020-11-03 DIAGNOSIS — M5412 Radiculopathy, cervical region: Secondary | ICD-10-CM

## 2020-11-03 DIAGNOSIS — E7800 Pure hypercholesterolemia, unspecified: Secondary | ICD-10-CM

## 2020-11-03 DIAGNOSIS — E1165 Type 2 diabetes mellitus with hyperglycemia: Secondary | ICD-10-CM

## 2020-11-03 DIAGNOSIS — E119 Type 2 diabetes mellitus without complications: Secondary | ICD-10-CM

## 2020-11-03 DIAGNOSIS — N401 Enlarged prostate with lower urinary tract symptoms: Secondary | ICD-10-CM

## 2020-11-03 DIAGNOSIS — R351 Nocturia: Secondary | ICD-10-CM

## 2020-11-03 DIAGNOSIS — E1169 Type 2 diabetes mellitus with other specified complication: Secondary | ICD-10-CM | POA: Insufficient documentation

## 2020-11-03 DIAGNOSIS — I10 Essential (primary) hypertension: Secondary | ICD-10-CM

## 2020-11-03 DIAGNOSIS — M5136 Other intervertebral disc degeneration, lumbar region: Secondary | ICD-10-CM

## 2020-11-03 DIAGNOSIS — E785 Hyperlipidemia, unspecified: Secondary | ICD-10-CM

## 2020-11-03 LAB — POCT GLYCOSYLATED HEMOGLOBIN (HGB A1C): Hemoglobin A1C: 5.8 % — AB (ref 4.0–5.6)

## 2020-11-03 MED ORDER — PREDNISONE 20 MG PO TABS: ORAL_TABLET | ORAL | 0 refills | Status: DC

## 2020-11-03 MED ORDER — METFORMIN HCL 500 MG PO TABS: ORAL_TABLET | ORAL | 1 refills | Status: DC

## 2020-11-03 MED ORDER — TADALAFIL 5 MG PO TABS: 5.0000 mg | ORAL_TABLET | Freq: Every day | ORAL | 3 refills | Status: DC

## 2020-11-03 MED ORDER — AMLODIPINE BESYLATE 10 MG PO TABS: ORAL_TABLET | ORAL | 1 refills | Status: DC

## 2020-11-03 MED ORDER — METOPROLOL SUCCINATE ER 50 MG PO TB24: ORAL_TABLET | ORAL | 1 refills | Status: DC

## 2020-11-03 MED ORDER — ATORVASTATIN CALCIUM 40 MG PO TABS: ORAL_TABLET | ORAL | 3 refills | Status: DC

## 2020-11-03 NOTE — Progress Notes (Addendum)
Established Patient Office Visit  Subjective:  Patient ID: Juan Juan, male    DOB: [birth year]  Age: 50 y.o. MRN: [id number]  CC:  Chief Complaint  Patient presents with  . Diabetes  . Hypertension    HPI Juan Juan presents for diabetes mellitus type 2 follow up.  He reports over the last year he has had success in monitoring his glucose, diet control, and eliminating alcohol and tobacco use from his life.  He states that he tries to keep his glucose levels between 80-95, and has had good success in maintaining this level.  He reports that his vision is being managed by an optometrist/opthalmologist, and that while he initially had difficulty reading up close, his vision has changed for the better, and he can read without glasses again.  He denies any changes in sensation, numbness, tingling in his feet or left hand (see below for radicular sx in right arm/hand).    No hypoglycemic events.   He reports that his back pain has worsened, and since his last evaluation for degenerative disk/radiculopathy in 2016, he has developed a numbness and tingling in his right arm when he sleeps at night with 2 pillows under his head, especially when he moves to hold his wife with that arm.  He experiences this numbness in all fingers on his right hand.  Additionally his back and left elbow pain persist despite the use of flexeril.  He states he has a high tolerance for pain and works alone in his [employer] shop.  Juan Hensley asks if chiropractic evaluation would be helpful.    Ferguson mentions that his right ear has felt full, as though it cannot "pop" when he yawns since his dentist prescribed him doxycycline.  Denies pain in ear, sore throat, illness.  He is planning to have several teeth removed by dentist in future.      Past Medical History:  Diagnosis Date  . Elevated fasting glucose 03/27/2019  . Hypertension   . Sinus disease     History reviewed. No pertinent surgical history.  Family History   Problem Relation Age of Onset  . Hypertension Mother   . Diabetes Mother   . Healthy Father     Social History   Socioeconomic History  . Marital status: Single    Spouse name: Not on file  . Number of children: Not on file  . Years of education: Not on file  . Highest education level: Not on file  Occupational History  . Not on file  Tobacco Use  . Smoking status: Former Smoker    Packs/day: 0.10    Types: Cigarettes    Quit date: 03/11/2018    Years since quitting: 2.6  . Smokeless tobacco: Never Used  Substance and Sexual Activity  . Alcohol use: Not Currently    Comment: social  . Drug use: Not Currently    Types: Marijuana  . Sexual activity: Yes  Other Topics Concern  . Not on file  Social History Narrative  . Not on file   Social Determinants of Health   Financial Resource Strain: Not on file  Food Insecurity: Not on file  Transportation Needs: Not on file  Physical Activity: Not on file  Stress: Not on file  Social Connections: Not on file  Intimate Partner Violence: Not on file    Outpatient Medications Prior to Visit  Medication Sig Dispense Refill  . blood glucose meter kit and supplies KIT Dispense based on patient and  insurance preference. Use up to four times daily as directed. Please include lancets, test strips, control solution. 1 each PRN  . cyclobenzaprine (FLEXERIL) 10 MG tablet TAKE ONE TABLET BY MOUTH THREE TIMES DAILY AS NEEDED FOR MUSCLE SPASMS. 90 tablet 1  . mupirocin ointment (BACTROBAN) 2 % Apply to wound 3 times daily for 5 days 22 g 0  . naproxen (NAPROSYN) 500 MG tablet TAKE 1 TABLET BY MOUTH TWICE DAILY WITH A MEAL 60 tablet 0  . amLODipine (NORVASC) 10 MG tablet TAKE 1 TABLET BY MOUTH ONCE DAILY. NEEDS APPT. 7 tablet 0  . atorvastatin (LIPITOR) 40 MG tablet TAKE 1 TABLET BY MOUTH ONCE DAILY NEEDS LABS. 7 tablet 0  . metFORMIN (GLUCOPHAGE) 500 MG tablet Take 1 tab (500 mg) with breakfast and 2 tabs (1000 mg) with dinner daily for  blood sugar. 270 tablet 3  . metoprolol succinate (TOPROL-XL) 50 MG 24 hr tablet TAKE 1 TABLET BY MOUTH ONCE DAILY NEEDS  APPOINTMENT 7 tablet 0  . tadalafil (CIALIS) 5 MG tablet Take 1 tablet (5 mg total) by mouth daily. Use daily. 30 tablet 11  . doxycycline (VIBRA-TABS) 100 MG tablet Take 1 tablet (100 mg total) by mouth 2 (two) times daily. 14 tablet 0   No facility-administered medications prior to visit.    Allergies  Allergen Reactions  . Lisinopril Swelling    Angioedema of lips 02/06/2015  . Penicillins Other (See Comments)    Had reaction as a baby; unaware of what reaction was.  . Cephalexin Swelling    ROS Review of Systems  Constitutional: Negative for appetite change, fatigue and fever.  HENT: Positive for dental problem. Negative for congestion, ear discharge, ear pain, postnasal drip, rhinorrhea, sinus pressure and sore throat.   Eyes: Negative for redness, itching and visual disturbance.  Respiratory: Negative for cough, chest tightness, shortness of breath and wheezing.   Cardiovascular: Negative for chest pain and palpitations.  Gastrointestinal: Negative for abdominal pain, constipation, diarrhea, nausea and vomiting.  Endocrine: Negative for polydipsia and polyuria.  Genitourinary: Negative for decreased urine volume, dysuria, flank pain, frequency and urgency.  Musculoskeletal: Positive for arthralgias, back pain, myalgias and neck pain.  Skin: Negative for color change and rash.  Neurological: Positive for numbness. Negative for syncope, speech difficulty, weakness and headaches.       Numbness: right hand at night when neck propped on 2 pillows and holding wife.    Hematological: Negative for adenopathy.  Psychiatric/Behavioral: Negative for confusion, decreased concentration and sleep disturbance. The patient is not nervous/anxious.       Objective:    Physical Exam Constitutional:      General: He is not in acute distress.    Appearance: Normal  appearance. He is not ill-appearing.  HENT:     Head: Normocephalic.     Right Ear: Tympanic membrane, ear canal and external ear normal. There is no impacted cerumen.     Left Ear: Tympanic membrane, ear canal and external ear normal. There is no impacted cerumen.     Nose: Nose normal. No congestion or rhinorrhea.     Mouth/Throat:     Mouth: Mucous membranes are moist.     Dentition: Abnormal dentition.     Pharynx: Oropharynx is clear. No oropharyngeal exudate.     Tonsils: No tonsillar exudate.      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   Eyes:     Extraocular Movements: Extraocular movements intact.     Conjunctiva/sclera: Conjunctivae normal.  Pupils: Pupils are equal, round, and reactive to light.  Cardiovascular:     Rate and Rhythm: Normal rate and regular rhythm.     Pulses: Normal pulses.     Heart sounds: Normal heart sounds. No murmur heard. No friction rub. No gallop.   Pulmonary:     Effort: Pulmonary effort is normal. No respiratory distress.     Breath sounds: Normal breath sounds. No wheezing, rhonchi or rales.  Abdominal:     General: Abdomen is flat. There is no distension.  Musculoskeletal:        General: No swelling, tenderness or deformity. Normal range of motion.     Cervical back: Normal range of motion and neck supple. No tenderness.     Comments: Negative Phalen's sign, bilaterally.  Upper ext strength 5/5, bilaterally.  NROM of neck.  Negative Spurling sign.   Skin:    General: Skin is warm.     Capillary Refill: Capillary refill takes less than 2 seconds.     Coloration: Skin is not jaundiced or pale.     Findings: No erythema.  Neurological:     General: No focal deficit present.     Mental Status: He is alert and oriented to person, place, and time. Mental status is at baseline.  Psychiatric:        Mood and Affect: Mood normal.        Behavior: Behavior normal.     BP (!) 144/90   Pulse 80   Ht $R'5\' 8"'wa$  (1.727 m)   Wt 230 lb  (104.3 kg)   SpO2 100%   BMI 34.97 kg/m  Wt Readings from Last 3 Encounters:  11/03/20 230 lb (104.3 kg)  03/06/20 211 lb 3.2 oz (95.8 kg)  02/05/20 207 lb (93.9 kg)     Health Maintenance Due  Topic Date Due  . Hepatitis C Screening  Never done  . COLONOSCOPY (Pts 45-40yrs Insurance coverage will need to be confirmed)  Never done  . COVID-19 Vaccine (3 - Booster for Pfizer series) 08/20/2020    There are no preventive care reminders to display for this patient.  No results found for: TSH Lab Results  Component Value Date   WBC 7.1 02/01/2020   HGB 16.1 02/01/2020   HCT 48.8 02/01/2020   MCV 82.9 02/01/2020   PLT 301 02/01/2020   Lab Results  Component Value Date   NA 133 (L) 02/01/2020   K 4.8 02/01/2020   CO2 21 02/01/2020   GLUCOSE 470 (H) 02/01/2020   BUN 19 02/01/2020   CREATININE 1.08 02/01/2020   BILITOT 0.4 03/26/2019   AST 15 03/26/2019   ALT 11 03/26/2019   PROT 6.9 03/26/2019   CALCIUM 9.8 02/01/2020   Lab Results  Component Value Date   CHOL 215 (H) 03/26/2019   Lab Results  Component Value Date   HDL 40 03/26/2019   Lab Results  Component Value Date   LDLCALC 157 (H) 03/26/2019   Lab Results  Component Value Date   TRIG 76 03/26/2019   Lab Results  Component Value Date   CHOLHDL 5.4 (H) 03/26/2019   Lab Results  Component Value Date   HGBA1C 5.8 (A) 11/03/2020      Assessment & Plan:  Marland KitchenMarland KitchenRandee was seen today for diabetes and hypertension.  Diagnoses and all orders for this visit:  Type 2 diabetes mellitus with hyperglycemia, without long-term current use of insulin (HCC) -     Discontinue: metFORMIN (GLUCOPHAGE) 500 MG tablet; Take  1 tab (500 mg) with breakfast and 2 tabs (1000 mg) with dinner daily for blood sugar. -     metFORMIN (GLUCOPHAGE) 500 MG tablet; Take 1 tab (500 mg) with breakfast and 2 tabs (1000 mg) with dinner daily for blood sugar.  Diabetes mellitus without complication (HCC) -     POCT glycosylated hemoglobin  (Hb A1C)  Essential hypertension, benign -     COMPLETE METABOLIC PANEL WITH GFR -     metoprolol succinate (TOPROL-XL) 50 MG 24 hr tablet; TAKE 1 TABLET BY MOUTH ONCE DAILY -     amLODipine (NORVASC) 10 MG tablet; TAKE 1 TABLET BY MOUTH ONCE DAILY.  Elevated LDL cholesterol level -     Lipid Panel w/reflex Direct LDL  Increased urinary frequency -     Discontinue: tadalafil (CIALIS) 5 MG tablet; Take 1 tablet (5 mg total) by mouth daily. Use daily. -     tadalafil (CIALIS) 5 MG tablet; Take 1 tablet (5 mg total) by mouth daily. Use daily.  Benign prostatic hyperplasia with nocturia -     Discontinue: tadalafil (CIALIS) 5 MG tablet; Take 1 tablet (5 mg total) by mouth daily. Use daily. -     tadalafil (CIALIS) 5 MG tablet; Take 1 tablet (5 mg total) by mouth daily. Use daily.  Hyperlipidemia associated with type 2 diabetes mellitus (HCC) -     atorvastatin (LIPITOR) 40 MG tablet; TAKE 1 TABLET BY MOUTH ONCE DAILY  Lumbar degenerative disc disease  Lumbar radiculopathy  Colon cancer screening -     Ambulatory referral to Gastroenterology  Cervical radiculopathy -     predniSONE (DELTASONE) 20 MG tablet; Take 3 tablets for 3 days, take 2 tablets for 3 days, take 1 tablet for 3 days, for 1/2 tablet for 4 days.  Other orders -     Discontinue: predniSONE (DELTASONE) 20 MG tablet; Take 3 tablets for 3 days, take 2 tablets for 3 days, take 1 tablet for 3 days, for 1/2 tablet for 4 days.  .. Lab Results  Component Value Date   HGBA1C 5.8 (A) 11/03/2020   A1C is great.  Continue on metformin.  BP not to goal. Not on ACE needs to consider additional medication. Recheck with appt with Dr. Darene Lamer if above 130/90 needs to follow up.  On STATIN.  Eye exam UTD.  Foot exam UTD.  Declines covid/pneumonia/flu vaccine.   Colonoscopy ordered.   Follow up with Dr. Darene Lamer on cervical and lumbar radiculopathy.last Xray of neck and low back was 2015 with lumbar and cervical DDD. Prednisone burst  given. Continue naproxen. Flexeril as needed. Discussed low back stretches.    Follow-up: Return in about 4 weeks (around 12/01/2020) for BP.    Marland KitchenVernetta Honey PA-C, have reviewed and agree with the above documentation in it's entirety.

## 2020-11-12 ENCOUNTER — Telehealth: Payer: Self-pay | Admitting: Neurology

## 2020-11-12 NOTE — Telephone Encounter (Signed)
Patient's wife left vm stating patient received all refills except his water pill for blood pressure. I don't see that he was on a water pill. Will call back to discuss. [phone].

## 2020-11-12 NOTE — Telephone Encounter (Signed)
LMOM for them to call back to let me know which medication they are referring to.

## 2020-12-04 ENCOUNTER — Encounter: Payer: Self-pay | Admitting: Gastroenterology

## 2021-01-09 ENCOUNTER — Ambulatory Visit (AMBULATORY_SURGERY_CENTER): Payer: Self-pay

## 2021-01-09 ENCOUNTER — Other Ambulatory Visit: Payer: Self-pay

## 2021-01-09 VITALS — Ht 68.0 in | Wt 228.0 lb

## 2021-01-09 DIAGNOSIS — Z1211 Encounter for screening for malignant neoplasm of colon: Secondary | ICD-10-CM

## 2021-01-09 MED ORDER — PEG 3350-KCL-NA BICARB-NACL 420 G PO SOLR: 4000.0000 mL | Freq: Once | ORAL | 0 refills | Status: AC

## 2021-01-09 NOTE — Progress Notes (Signed)
No allergies to soy or egg Pt is not on blood thinners or diet pills  Issues with sedation/intubation not able to assess-no procedural or surgical procedures.  Denies atrial flutter/fib Denies constipation    Pt is aware of Covid safety and care partner requirements.   Deberah Pelton, wife is with pt.

## 2021-02-04 ENCOUNTER — Encounter: Payer: Self-pay | Admitting: Gastroenterology

## 2021-02-04 ENCOUNTER — Other Ambulatory Visit: Payer: Self-pay

## 2021-02-04 ENCOUNTER — Ambulatory Visit (AMBULATORY_SURGERY_CENTER): Payer: Self-pay | Admitting: Gastroenterology

## 2021-02-04 ENCOUNTER — Other Ambulatory Visit: Payer: Self-pay | Admitting: Gastroenterology

## 2021-02-04 VITALS — BP 160/104 | HR 67 | Temp 97.7°F | Resp 20 | Ht 68.0 in | Wt 228.0 lb

## 2021-02-04 DIAGNOSIS — K635 Polyp of colon: Secondary | ICD-10-CM

## 2021-02-04 DIAGNOSIS — D125 Benign neoplasm of sigmoid colon: Secondary | ICD-10-CM

## 2021-02-04 DIAGNOSIS — Z1211 Encounter for screening for malignant neoplasm of colon: Secondary | ICD-10-CM

## 2021-02-04 MED ORDER — SODIUM CHLORIDE 0.9 % IV SOLN: 500.0000 mL | Freq: Once | INTRAVENOUS | Status: DC

## 2021-02-04 NOTE — Progress Notes (Signed)
Called to room to assist during endoscopic procedure.  Patient ID and intended procedure confirmed with present staff. Received instructions for my participation in the procedure from the performing physician.  

## 2021-02-04 NOTE — Progress Notes (Signed)
VS-CW  Pt's states no medical or surgical changes since previsit or office visit.  

## 2021-02-04 NOTE — Progress Notes (Signed)
Pt Drowsy. VSS. To PACU, report to RN. No anesthetic complications noted.  

## 2021-02-04 NOTE — Patient Instructions (Signed)
Discharge instructions given. Handouts on polyps and Diverticulosis. Resume previous medications. YOU HAD AN ENDOSCOPIC PROCEDURE TODAY AT [facility]:   Refer to the procedure report that was given to you for any specific questions about what was found during the examination.  If the procedure report does not answer your questions, please call your gastroenterologist to clarify.  If you requested that your care partner not be given the details of your procedure findings, then the procedure report has been included in a sealed envelope for you to review at your convenience later.  YOU SHOULD EXPECT: Some feelings of bloating in the abdomen. Passage of more gas than usual.  Walking can help get rid of the air that was put into your GI tract during the procedure and reduce the bloating. If you had a lower endoscopy (such as a colonoscopy or flexible sigmoidoscopy) you may notice spotting of blood in your stool or on the toilet paper. If you underwent a bowel prep for your procedure, you may not have a normal bowel movement for a few days.  Please Note:  You might notice some irritation and congestion in your nose or some drainage.  This is from the oxygen used during your procedure.  There is no need for concern and it should clear up in a day or so.  SYMPTOMS TO REPORT IMMEDIATELY:   Following lower endoscopy (colonoscopy or flexible sigmoidoscopy):  Excessive amounts of blood in the stool  Significant tenderness or worsening of abdominal pains  Swelling of the abdomen that is new, acute  Fever of 100F or higher   For urgent or emergent issues, a gastroenterologist can be reached at any hour by calling [phone]. Do not use MyChart messaging for urgent concerns.    DIET:  We do recommend a small meal at first, but then you may proceed to your regular diet.  Drink plenty of fluids but you should avoid alcoholic beverages for 24 hours.  ACTIVITY:  You should plan to take  it easy for the rest of today and you should NOT DRIVE or use heavy machinery until tomorrow (because of the sedation medicines used during the test).    FOLLOW UP: Our staff will call the number listed on your records 48-72 hours following your procedure to check on you and address any questions or concerns that you may have regarding the information given to you following your procedure. If we do not reach you, we will leave a message.  We will attempt to reach you two times.  During this call, we will ask if you have developed any symptoms of COVID 19. If you develop any symptoms (ie: fever, flu-like symptoms, shortness of breath, cough etc.) before then, please call [phone].  If you test positive for Covid 19 in the 2 weeks post procedure, please call and report this information to [place].    If any biopsies were taken you will be contacted by phone or by letter within the next 1-3 weeks.  Please call us at [phone] if you have not heard about the biopsies in 3 weeks.    SIGNATURES/CONFIDENTIALITY: You and/or your care partner have signed paperwork which will be entered into your electronic medical record.  These signatures attest to the fact that that the information above on your After Visit Summary has been reviewed and is understood.  Full responsibility of the confidentiality of this discharge information lies with you and/or your care-partner.

## 2021-02-04 NOTE — Op Note (Signed)
[facility] Patient Name: Juan Hensley Procedure Date: 02/04/2021 7:54 AM MRN: [id number] Endoscopist: [place]. Loletha Carrow , MD Age: 50 Referring MD:  Date of Birth: 04-22-1971 Gender: Male Account #: [id number] Procedure:                Colonoscopy Indications:              Screening for colorectal malignant neoplasm, This                            is the patient's first colonoscopy Medicines:                Monitored Anesthesia Care Procedure:                Pre-Anesthesia Assessment:                           - Prior to the procedure, a History and Physical                            was performed, and patient medications and                            allergies were reviewed. The patient's tolerance of                            previous anesthesia was also reviewed. The risks                            and benefits of the procedure and the sedation                            options and risks were discussed with the patient.                            All questions were answered, and informed consent                            was obtained. Prior Anticoagulants: The patient has                            taken no previous anticoagulant or antiplatelet                            agents. ASA Grade Assessment: II - A patient with                            mild systemic disease. After reviewing the risks                            and benefits, the patient was deemed in                            satisfactory condition to undergo the procedure.  After obtaining informed consent, the colonoscope                            was passed under direct vision. Throughout the                            procedure, the patient's blood pressure, pulse, and                            oxygen saturations were monitored continuously. The                            Olympus CF-HQ190 [id number]) Colonoscope was                            introduced through the anus and  advanced to the the                            cecum, identified by appendiceal orifice and                            ileocecal valve. The colonoscopy was performed                            without difficulty. The patient tolerated the                            procedure well. The quality of the bowel                            preparation was excellent. The ileocecal valve,                            appendiceal orifice, and rectum were photographed.                            The bowel preparation used was Plenvu. Scope In: 8:05:08 AM Scope Out: 8:22:25 AM Scope Withdrawal Time: 0 hours 14 minutes 8 seconds  Total Procedure Duration: 0 hours 17 minutes 17 seconds  Findings:                 The perianal and digital rectal examinations were                            normal.                           Many small-mouthed diverticula were found in the                            left colon. (associated haustral thickening)                           A diminutive polyp was found in the recto-sigmoid  colon. The polyp was sessile. The polyp was removed                            with a cold snare. Resection and retrieval were                            complete.                           The exam was otherwise without abnormality on                            direct and retroflexion views. Complications:            No immediate complications. Estimated Blood Loss:     Estimated blood loss was minimal. Impression:               - Diverticulosis in the left colon.                           - One diminutive polyp at the recto-sigmoid colon,                            removed with a cold snare. Resected and retrieved.                           - The examination was otherwise normal on direct                            and retroflexion views. Recommendation:           - Patient has a contact number available for                            emergencies. The signs and  symptoms of potential                            delayed complications were discussed with the                            patient. Return to normal activities tomorrow.                            Written discharge instructions were provided to the                            patient.                           - Resume previous diet.                           - Continue present medications.                           - Await pathology results.                           -  Repeat colonoscopy is recommended for                            surveillance. The colonoscopy date will be                            determined after pathology results from today's                            exam become available for review. Carmichael Burdette L. Loletha Carrow, MD 02/04/2021 8:27:45 AM This report has been signed electronically.

## 2021-02-05 ENCOUNTER — Other Ambulatory Visit: Payer: Self-pay

## 2021-02-05 ENCOUNTER — Ambulatory Visit (INDEPENDENT_AMBULATORY_CARE_PROVIDER_SITE_OTHER): Payer: Self-pay | Admitting: Podiatry

## 2021-02-05 DIAGNOSIS — M79674 Pain in right toe(s): Secondary | ICD-10-CM

## 2021-02-05 DIAGNOSIS — L600 Ingrowing nail: Secondary | ICD-10-CM

## 2021-02-05 MED ORDER — DOXYCYCLINE HYCLATE 100 MG PO TABS: 100.0000 mg | ORAL_TABLET | Freq: Two times a day (BID) | ORAL | 0 refills | Status: DC

## 2021-02-05 NOTE — Patient Instructions (Addendum)
Soak Instructions  HAVE SOMEONE ELSE CHECK THE TEMPERATURE OF THE WATER.     THE DAY AFTER THE PROCEDURE  Place 1/4 cup of epsom salts in a quart of warm tap water.  Submerge your foot or feet with outer bandage intact for the initial soak; this will allow the bandage to become moist and wet for easy lift off.  Once you remove your bandage, continue to soak in the solution for 20 minutes.  This soak should be done twice a day.  Next, remove your foot or feet from solution, blot dry the affected area and cover.  You may use a band aid large enough to cover the area or use gauze and tape.  Apply other medications to the area as directed by the doctor such as polysporin neosporin.  IF YOUR SKIN BECOMES IRRITATED WHILE USING THESE INSTRUCTIONS, IT IS OKAY TO SWITCH TO  [place] VINEGAR AND WATER. Or you may use antibacterial soap and water to keep the toe clean  Monitor for any signs/symptoms of infection. Call the office immediately if any occur or go directly to the emergency room. Call with any questions/concerns.   [facility] Instructions-Post Nail Surgery  You have had your ingrown toenail and root treated with a chemical.  This chemical causes a burn that will drain and ooze like a blister.  This can drain for 6-8 weeks or longer.  It is important to keep this area clean, covered, and follow the soaking instructions dispensed at the time of your surgery.  This area will eventually dry and form a scab.  Once the scab forms you no longer need to soak or apply a dressing.  If at any time you experience an increase in pain, redness, swelling, or drainage, you should contact the office as soon as possible.

## 2021-02-06 ENCOUNTER — Telehealth: Payer: Self-pay

## 2021-02-06 NOTE — Telephone Encounter (Signed)
  Follow up Call-  Call back number 02/04/2021  Post procedure Call Back phone  # [phone]  Permission to leave phone message Yes  Some recent data might be hidden     Patient questions:  Do you have a fever, pain , or abdominal swelling? No. Pain Score  0 *  Have you tolerated food without any problems? Yes.    Have you been able to return to your normal activities? Yes.    Do you have any questions about your discharge instructions: Diet   No. Medications  No. Follow up visit  No.  Do you have questions or concerns about your Care? No.  Actions: * If pain score is 4 or above: No action needed, pain <4. 1. Have you developed a fever since your procedure? no  2.   Have you had an respiratory symptoms (SOB or cough) since your procedure? no  3.   Have you tested positive for COVID 19 since your procedure no  4.   Have you had any family members/close contacts diagnosed with the COVID 19 since your procedure?  no   If yes to any of these questions please route to Joylene John, RN and Joella Prince, RN

## 2021-02-10 NOTE — Progress Notes (Signed)
Subjective: 50 year old male presents the office today for concerns of ingrown toe of the right big toe, medial aspect which started become uncomfortable.  He states it is painful with pressure even with shoes.  Denies any drainage or pus or any swelling. Denies any systemic complaints such as fevers, chills, nausea, vomiting. No acute changes since last appointment, and no other complaints at this time.   The last A1c was 5.8 on November 03, 2020.  Objective: AAO x3, NAD DP/PT pulses palpable bilaterally, CRT less than 3 seconds There is incurvation present to the right medial hallux nail fold with localized edema and faint erythema likely more from inflammation as opposed to infection.  No drainage or pus or any ascending cellulitis.  There is no malodor.  No open lesions.  The nails are hypertrophic, dystrophic with brown discoloration.  No other acute discomfort identified today. No pain with calf compression, swelling, warmth, erythema  Assessment: Right medial hallux nail border ingrown toenail  Plan: -All treatment options discussed with the patient including all alternatives, risks, complications.  -At this time, the patient is requesting partial nail removal with chemical matricectomy to the symptomatic portion of the nail. Risks and complications were discussed with the patient for which they understand and written consent was obtained. Under sterile conditions a total of 3 mL of a mixture of 2% lidocaine plain and 0.5% Marcaine plain was infiltrated in a hallux block fashion. Once anesthetized, the skin was prepped in sterile fashion. A tourniquet was then applied. Next the medial aspect of hallux nail border was then sharply excised making sure to remove the entire offending nail border. Once the nails were ensured to be removed area was debrided and the underlying skin was intact. There is no purulence identified in the procedure. Next phenol was then applied under standard conditions  and copiously irrigated. Silvadene was applied. A dry sterile dressing was applied. After application of the dressing the tourniquet was removed and there is found to be an immediate capillary refill time to the digit. The patient tolerated the procedure well any complications. Post procedure instructions were discussed the patient for which he verbally understood. Follow-up in one week for nail check or sooner if any problems are to arise. Discussed signs/symptoms of infection and directed to call the office immediately should any occur or go directly to the emergency room. In the meantime, encouraged to call the office with any questions, concerns, changes symptoms. -Doxycycline -Patient encouraged to call the office with any questions, concerns, change in symptoms.   Trula Slade DPM

## 2021-02-12 ENCOUNTER — Other Ambulatory Visit: Payer: Self-pay

## 2021-02-12 ENCOUNTER — Ambulatory Visit: Payer: Self-pay | Admitting: Podiatry

## 2021-02-16 ENCOUNTER — Encounter: Payer: Self-pay | Admitting: Gastroenterology

## 2021-05-04 ENCOUNTER — Ambulatory Visit: Payer: Self-pay | Admitting: Physician Assistant

## 2021-05-04 ENCOUNTER — Telehealth: Payer: Self-pay | Admitting: Physician Assistant

## 2021-05-04 DIAGNOSIS — I10 Essential (primary) hypertension: Secondary | ICD-10-CM

## 2021-05-04 DIAGNOSIS — E1165 Type 2 diabetes mellitus with hyperglycemia: Secondary | ICD-10-CM

## 2021-05-04 MED ORDER — AMLODIPINE BESYLATE 10 MG PO TABS: ORAL_TABLET | ORAL | 0 refills | Status: DC

## 2021-05-04 MED ORDER — METOPROLOL SUCCINATE ER 50 MG PO TB24: ORAL_TABLET | ORAL | 0 refills | Status: DC

## 2021-05-04 NOTE — Telephone Encounter (Signed)
Agree with plan 

## 2021-05-04 NOTE — Telephone Encounter (Signed)
Patient was late for appointment today, rescheduled for Wednesday at 10:30am but patient stated he would be out of medication by then and didn't know if he could have a refill up until his rescheduled appointment date. AM

## 2021-05-04 NOTE — Telephone Encounter (Signed)
Patient's wife stated it should be his Blood Pressure medications and she stated it should be 2 of them. She stated they have checked with pharmacy and no refills since he was supposed to be coming this month for his 6mo check up. AM

## 2021-05-04 NOTE — Telephone Encounter (Signed)
Prescriptions sent

## 2021-05-04 NOTE — Telephone Encounter (Signed)
Did he say which medication? He is on multiple.

## 2021-05-06 ENCOUNTER — Ambulatory Visit (INDEPENDENT_AMBULATORY_CARE_PROVIDER_SITE_OTHER): Payer: Self-pay | Admitting: Physician Assistant

## 2021-05-06 ENCOUNTER — Encounter: Payer: Self-pay | Admitting: Physician Assistant

## 2021-05-06 ENCOUNTER — Other Ambulatory Visit: Payer: Self-pay

## 2021-05-06 VITALS — BP 174/108 | HR 76 | Ht 68.0 in | Wt 224.0 lb

## 2021-05-06 DIAGNOSIS — E785 Hyperlipidemia, unspecified: Secondary | ICD-10-CM

## 2021-05-06 DIAGNOSIS — E1165 Type 2 diabetes mellitus with hyperglycemia: Secondary | ICD-10-CM

## 2021-05-06 DIAGNOSIS — I10 Essential (primary) hypertension: Secondary | ICD-10-CM

## 2021-05-06 DIAGNOSIS — E1169 Type 2 diabetes mellitus with other specified complication: Secondary | ICD-10-CM

## 2021-05-06 LAB — COMPLETE METABOLIC PANEL WITH GFR
AG Ratio: 1.5 (calc) (ref 1.0–2.5)
ALT: 16 U/L (ref 9–46)
AST: 17 U/L (ref 10–35)
Albumin: 4.5 g/dL (ref 3.6–5.1)
Alkaline phosphatase (APISO): 91 U/L (ref 35–144)
BUN: 15 mg/dL (ref 7–25)
CO2: 27 mmol/L (ref 20–32)
Calcium: 9.7 mg/dL (ref 8.6–10.3)
Chloride: 104 mmol/L (ref 98–110)
Creat: 1.11 mg/dL (ref 0.70–1.30)
Globulin: 3 g/dL (calc) (ref 1.9–3.7)
Glucose, Bld: 103 mg/dL — ABNORMAL HIGH (ref 65–99)
Potassium: 4.8 mmol/L (ref 3.5–5.3)
Sodium: 139 mmol/L (ref 135–146)
Total Bilirubin: 0.6 mg/dL (ref 0.2–1.2)
Total Protein: 7.5 g/dL (ref 6.1–8.1)
eGFR: 80 mL/min/{1.73_m2} (ref >=60)

## 2021-05-06 LAB — POCT GLYCOSYLATED HEMOGLOBIN (HGB A1C): HbA1c, POC (controlled diabetic range): 6 % (ref 0.0–7.0)

## 2021-05-06 LAB — LIPID PANEL W/REFLEX DIRECT LDL
Cholesterol: 150 mg/dL (ref <200)
HDL: 38 mg/dL — ABNORMAL LOW (ref >=40)
LDL Cholesterol (Calc): 95 mg/dL (calc)
Non-HDL Cholesterol (Calc): 112 mg/dL (calc) (ref <130)
Total CHOL/HDL Ratio: 3.9 (calc) (ref <5.0)
Triglycerides: 77 mg/dL (ref <150)

## 2021-05-06 LAB — POCT UA - MICROALBUMIN
Albumin/Creatinine Ratio, Urine, POC: <30
Creatinine, POC: 300 mg/dL
Microalbumin Ur, POC: 30 mg/L

## 2021-05-06 MED ORDER — METFORMIN HCL 500 MG PO TABS: ORAL_TABLET | ORAL | 1 refills | Status: DC

## 2021-05-06 MED ORDER — AMLODIPINE BESYLATE 10 MG PO TABS: ORAL_TABLET | ORAL | 1 refills | Status: DC

## 2021-05-06 NOTE — Progress Notes (Signed)
Subjective:    Patient ID: Juan Hensley, male    DOB: 02-11-1971, 50 y.o.   MRN: [id number]  HPI Patient is a 50 year old male with hypertension, type 2 diabetes, lumbar degenerative disc disease who presents to the clinic for medication follow-up.  Patient has been taking his metoprolol daily but has not picked up his Norvasc.  There was an error at the pharmacy and he never got it.  He has not been checking his blood pressures at home.  He denies any headache, vision changes, dizziness, chest pain, palpitations.  Patient is only on metformin for glucose control.  He denies any hypoglycemic events.  He is not checking his blood sugars.  He is not necessarily making any diet changes or exercise to help combat this.  He denies any open sores or wounds.  He denies any numbness or tingling of extremities. .. Active Ambulatory Problems    Diagnosis Date Noted   Lumbar degenerative disc disease 10/30/2014   Arthritis of elbow, left 12/20/2014   Essential hypertension, benign 01/09/2015   Tobacco dependence 01/14/2015   Lumbar radiculopathy 10/31/2018   Elevated LDL cholesterol level 03/27/2019   Submental mass 03/27/2019   Ganglion cyst 03/27/2019   Diabetes mellitus without complication ([place]) [id number]   Ingrown toenail 06/27/2020   Type 2 diabetes mellitus with hyperglycemia, without long-term current use of insulin ([place]) 11/03/2020   Hyperlipidemia associated with type 2 diabetes mellitus ([place]) 11/03/2020   Resolved Ambulatory Problems    Diagnosis Date Noted   Elevated fasting glucose 03/27/2019   Past Medical History:  Diagnosis Date   Arthritis    Hyperlipidemia    Hypertension    Sinus disease      Review of Systems See HPI.     Objective:   Physical Exam Vitals reviewed.  Constitutional:      Appearance: Normal appearance. He is obese.  HENT:     Head: Normocephalic.  Neck:     Vascular: No carotid bruit.  Cardiovascular:     Rate and Rhythm: Normal rate and  regular rhythm.     Pulses: Normal pulses.     Heart sounds: Normal heart sounds.  Pulmonary:     Effort: Pulmonary effort is normal.     Breath sounds: Normal breath sounds.  Musculoskeletal:     Right lower leg: No edema.     Left lower leg: No edema.  Neurological:     General: No focal deficit present.     Mental Status: He is alert and oriented to person, place, and time.  Psychiatric:        Mood and Affect: Mood normal.         .. Results for orders placed or performed in visit on 05/06/21  POCT glycosylated hemoglobin (Hb A1C)  Result Value Ref Range   Hemoglobin A1C     HbA1c POC (<> result, manual entry)     HbA1c, POC (prediabetic range)     HbA1c, POC (controlled diabetic range) 6.0 0.0 - 7.0 %  POCT UA - Microalbumin  Result Value Ref Range   Microalbumin Ur, POC 30 mg/L   Creatinine, POC 300 mg/dL   Albumin/Creatinine Ratio, Urine, POC <30     Assessment & Plan:  Marland KitchenMarland KitchenBrittani was seen today for diabetes.  Diagnoses and all orders for this visit:  Uncontrolled hypertension  Type 2 diabetes mellitus with hyperglycemia, without long-term current use of insulin (HCC) -     POCT glycosylated hemoglobin (Hb A1C) -  POCT UA - Microalbumin -     metFORMIN (GLUCOPHAGE) 500 MG tablet; Take 1 tab (500 mg) with breakfast and 2 tabs (1000 mg) with dinner daily for blood sugar.  Essential hypertension, benign -     amLODipine (NORVASC) 10 MG tablet; TAKE 1 TABLET BY MOUTH ONCE DAILY.   a1C to goal.  Continue metoformin.  Mircoalbumin normal.  Foot exam UTD.  Need DM eye exam.  On statin. Recheck lipid today.  Not on ARB/ACE. Swelling with ACE.  BP not to goal. Never picked up norvasc from pharmacy. Discussed urgency of this.  Recheck BP at home. Recheck in 1 month.  Follow up in 6 months on DM due to self pay status.  Declined pneumonia vaccine.  Needs last covid booster.

## 2021-05-07 ENCOUNTER — Other Ambulatory Visit: Payer: Self-pay | Admitting: Physician Assistant

## 2021-05-07 MED ORDER — ATORVASTATIN CALCIUM 80 MG PO TABS: 80.0000 mg | ORAL_TABLET | Freq: Every day | ORAL | 3 refills | Status: DC

## 2021-05-07 NOTE — Progress Notes (Signed)
Goal LDL is 70. Not quite to goal. Increase lipitor to '80mg'$ .  Kidney, liver look great.

## 2021-05-08 ENCOUNTER — Encounter: Payer: Self-pay | Admitting: Physician Assistant

## 2021-06-04 ENCOUNTER — Other Ambulatory Visit: Payer: Self-pay | Admitting: Physician Assistant

## 2021-06-04 DIAGNOSIS — I10 Essential (primary) hypertension: Secondary | ICD-10-CM

## 2021-07-05 ENCOUNTER — Other Ambulatory Visit: Payer: Self-pay | Admitting: Physician Assistant

## 2021-07-05 DIAGNOSIS — I10 Essential (primary) hypertension: Secondary | ICD-10-CM

## 2021-12-14 ENCOUNTER — Other Ambulatory Visit: Payer: Self-pay | Admitting: Physician Assistant

## 2021-12-14 DIAGNOSIS — R350 Frequency of micturition: Secondary | ICD-10-CM

## 2021-12-14 DIAGNOSIS — N401 Enlarged prostate with lower urinary tract symptoms: Secondary | ICD-10-CM

## 2021-12-14 DIAGNOSIS — I10 Essential (primary) hypertension: Secondary | ICD-10-CM

## 2021-12-19 ENCOUNTER — Other Ambulatory Visit: Payer: Self-pay | Admitting: Physician Assistant

## 2021-12-19 DIAGNOSIS — I10 Essential (primary) hypertension: Secondary | ICD-10-CM

## 2022-01-31 ENCOUNTER — Other Ambulatory Visit: Payer: Self-pay | Admitting: Physician Assistant

## 2022-01-31 DIAGNOSIS — I10 Essential (primary) hypertension: Secondary | ICD-10-CM

## 2022-02-04 ENCOUNTER — Telehealth: Payer: Self-pay | Admitting: Neurology

## 2022-02-04 MED ORDER — METRONIDAZOLE 500 MG PO TABS
500.0000 mg | ORAL_TABLET | Freq: Two times a day (BID) | ORAL | 0 refills | Status: AC
Start: 2022-02-04 — End: 2022-02-11

## 2022-02-04 NOTE — Telephone Encounter (Signed)
Left VM letting patient know that a medication was sent to pharmacy after receiving a call from wife. To call with questions/or if needs any further details.  ?

## 2022-02-04 NOTE — Telephone Encounter (Signed)
Patient's wife called and left vm stating she tested positive for trichomonas and patient needs treatment as well. Please advise. [phone].  ?

## 2022-02-14 ENCOUNTER — Other Ambulatory Visit: Payer: Self-pay | Admitting: Physician Assistant

## 2022-02-14 DIAGNOSIS — I10 Essential (primary) hypertension: Secondary | ICD-10-CM

## 2022-03-03 ENCOUNTER — Telehealth: Payer: Self-pay | Admitting: Physician Assistant

## 2022-03-03 DIAGNOSIS — I10 Essential (primary) hypertension: Secondary | ICD-10-CM

## 2022-03-03 DIAGNOSIS — E1165 Type 2 diabetes mellitus with hyperglycemia: Secondary | ICD-10-CM

## 2022-03-03 MED ORDER — METOPROLOL SUCCINATE ER 50 MG PO TB24: ORAL_TABLET | ORAL | 0 refills | Status: DC

## 2022-03-03 MED ORDER — METFORMIN HCL 500 MG PO TABS: ORAL_TABLET | ORAL | 0 refills | Status: DC

## 2022-03-03 NOTE — Telephone Encounter (Signed)
LVM informing pt of RXs sent to pharmacy.  Charyl Bigger, CMA

## 2022-03-03 NOTE — Telephone Encounter (Signed)
Patient's wife called and said he is out of BP meds. He has been scheduled for an appt on 6/1. He just needs a refill until then.

## 2022-03-03 NOTE — Telephone Encounter (Signed)
Meds ordered this encounter  Medications   metoprolol succinate (TOPROL-XL) 50 MG 24 hr tablet    Sig: Take 1 tablet by mouth once daily/ FINAL REFILL NEEDS APPT    Dispense:  15 tablet    Refill:  0   metFORMIN (GLUCOPHAGE) 500 MG tablet    Sig: Take 1 tab (500 mg) with breakfast and 2 tabs (1000 mg) with dinner daily for blood sugar.    Dispense:  30 tablet    Refill:  0

## 2022-03-11 ENCOUNTER — Ambulatory Visit (INDEPENDENT_AMBULATORY_CARE_PROVIDER_SITE_OTHER): Payer: Self-pay | Admitting: Physician Assistant

## 2022-03-11 ENCOUNTER — Encounter: Payer: Self-pay | Admitting: Physician Assistant

## 2022-03-11 VITALS — BP 132/82 | HR 82 | Ht 68.0 in | Wt 228.0 lb

## 2022-03-11 DIAGNOSIS — E785 Hyperlipidemia, unspecified: Secondary | ICD-10-CM

## 2022-03-11 DIAGNOSIS — R350 Frequency of micturition: Secondary | ICD-10-CM

## 2022-03-11 DIAGNOSIS — E1165 Type 2 diabetes mellitus with hyperglycemia: Secondary | ICD-10-CM

## 2022-03-11 DIAGNOSIS — N401 Enlarged prostate with lower urinary tract symptoms: Secondary | ICD-10-CM

## 2022-03-11 DIAGNOSIS — I10 Essential (primary) hypertension: Secondary | ICD-10-CM

## 2022-03-11 DIAGNOSIS — R351 Nocturia: Secondary | ICD-10-CM

## 2022-03-11 DIAGNOSIS — E1169 Type 2 diabetes mellitus with other specified complication: Secondary | ICD-10-CM

## 2022-03-11 LAB — POCT GLYCOSYLATED HEMOGLOBIN (HGB A1C): Hemoglobin A1C: 6.4 % — AB (ref 4.0–5.6)

## 2022-03-11 LAB — POCT UA - MICROALBUMIN
Albumin/Creatinine Ratio, Urine, POC: <30
Creatinine, POC: 300 mg/dL
Microalbumin Ur, POC: 30 mg/L

## 2022-03-11 MED ORDER — TADALAFIL 5 MG PO TABS: ORAL_TABLET | ORAL | 1 refills | Status: DC

## 2022-03-11 MED ORDER — METFORMIN HCL 500 MG PO TABS: ORAL_TABLET | ORAL | 1 refills | Status: DC

## 2022-03-11 MED ORDER — METOPROLOL SUCCINATE ER 50 MG PO TB24: ORAL_TABLET | ORAL | 1 refills | Status: DC

## 2022-03-11 MED ORDER — AMLODIPINE BESYLATE 10 MG PO TABS: 10.0000 mg | ORAL_TABLET | Freq: Every day | ORAL | 1 refills | Status: DC

## 2022-03-11 NOTE — Progress Notes (Signed)
   Established Patient Office Visit  Subjective   Patient ID: Juan Hensley, male    DOB: 1971-02-28  Age: 51 y.o. MRN: [id number]  Chief Complaint  Patient presents with  . Diabetes  . Hypertension    HPI  {History (Optional):23778}  ROS    Objective:     BP 132/82   Pulse 82   Ht '5\' 8"'$  (1.727 m)   Wt 228 lb (103.4 kg)   SpO2 97%   BMI 34.67 kg/m  {Vitals History (Optional):23777}  Physical Exam   No results found for any visits on 03/11/22.  {Labs (Optional):23779}  The 10-year ASCVD risk score (Arnett DK, et al., 2019) is: 17.7%    Assessment & Plan:   Problem List Items Addressed This Visit       Unprioritized   Essential hypertension, benign   Type 2 diabetes mellitus with hyperglycemia, without long-term current use of insulin ([place]) - Primary   Relevant Orders   POCT glycosylated hemoglobin (Hb A1C)   Hyperlipidemia associated with type 2 diabetes mellitus ([place])    No follow-ups on file.    Iran Planas, PA-C

## 2022-03-12 DIAGNOSIS — N401 Enlarged prostate with lower urinary tract symptoms: Secondary | ICD-10-CM | POA: Insufficient documentation

## 2022-03-21 ENCOUNTER — Other Ambulatory Visit: Payer: Self-pay | Admitting: Physician Assistant

## 2022-03-21 DIAGNOSIS — M5136 Other intervertebral disc degeneration, lumbar region: Secondary | ICD-10-CM

## 2022-03-21 DIAGNOSIS — M5416 Radiculopathy, lumbar region: Secondary | ICD-10-CM

## 2022-04-02 ENCOUNTER — Ambulatory Visit (INDEPENDENT_AMBULATORY_CARE_PROVIDER_SITE_OTHER): Payer: Self-pay | Admitting: Sports Medicine

## 2022-04-02 ENCOUNTER — Ambulatory Visit (INDEPENDENT_AMBULATORY_CARE_PROVIDER_SITE_OTHER): Payer: Self-pay

## 2022-04-02 DIAGNOSIS — M19022 Primary osteoarthritis, left elbow: Secondary | ICD-10-CM

## 2022-04-02 DIAGNOSIS — M5136 Other intervertebral disc degeneration, lumbar region: Secondary | ICD-10-CM

## 2022-04-02 DIAGNOSIS — M47812 Spondylosis without myelopathy or radiculopathy, cervical region: Secondary | ICD-10-CM

## 2022-04-02 DIAGNOSIS — M51369 Other intervertebral disc degeneration, lumbar region without mention of lumbar back pain or lower extremity pain: Secondary | ICD-10-CM

## 2022-04-02 MED ORDER — MELOXICAM 15 MG PO TABS: ORAL_TABLET | ORAL | 3 refills | Status: DC

## 2022-04-02 MED ORDER — PREDNISONE 50 MG PO TABS: ORAL_TABLET | ORAL | 0 refills | Status: DC

## 2022-04-02 NOTE — Assessment & Plan Note (Signed)
 This is a very pleasant 51 year old male, I last saw him in 2016, he has aged advanced osteoarthritis of the elbow with an intra-articular loose body. We did an injection in 2016 and he did extremely well until recently. Now having recurrence of pain, we will treat this again conservatively. Updated x-rays, meloxicam , return to see me in 6 weeks, injection if not better.

## 2022-04-02 NOTE — Progress Notes (Signed)
    Procedures performed today:    None.  Independent interpretation of notes and tests performed by another provider:   None.  Brief History, Exam, Impression, and Recommendations:    Primary osteoarthritis of left elbow This is a very pleasant 51 year old male, I last saw him in 2016, he has aged advanced osteoarthritis of the elbow with an intra-articular loose body. We did an injection in 2016 and he did extremely well until recently. Now having recurrence of pain, we will treat this again conservatively. Updated x-rays, meloxicam , return to see me in 6 weeks, injection if not better.  Lumbar degenerative disc disease I also saw him in 2016 for lumbar degenerative disc disease, he did have an L3-L4 disc protrusion, and some left-sided L4-S1 facet arthritis. He had an L3-L4 interlaminar epidural on the left back in 2016 and had 100% pain relief. We will start again conservatively with 5 days of prednisone , updated x-rays and home conditioning, if not better in 6 weeks we will proceed with MRI for an additional epidural.  Cervical spondylosis Chronic axial neck pain with occasional trapezial radiating pain. Worse with prolonged flexion. Adding x-rays, home conditioning, prednisone  and meloxicam  as below, return to see me in 6 weeks, MRI for interventional planning if not better.    ___________________________________________ Debby PARAS. Curtis, M.D., ABFM., CAQSM. Primary Care and Sports Medicine [facility]  Adjunct Instructor of Family Medicine  [employer]

## 2022-04-02 NOTE — Assessment & Plan Note (Signed)
Chronic axial neck pain with occasional trapezial radiating pain. Worse with prolonged flexion. Adding x-rays, home conditioning, prednisone and meloxicam as below, return to see me in 6 weeks, MRI for interventional planning if not better.

## 2022-04-02 NOTE — Assessment & Plan Note (Signed)
 I also saw him in 2016 for lumbar degenerative disc disease, he did have an L3-L4 disc protrusion, and some left-sided L4-S1 facet arthritis. He had an L3-L4 interlaminar epidural on the left back in 2016 and had 100% pain relief. We will start again conservatively with 5 days of prednisone , updated x-rays and home conditioning, if not better in 6 weeks we will proceed with MRI for an additional epidural.

## 2022-05-14 ENCOUNTER — Ambulatory Visit: Payer: Self-pay | Admitting: Sports Medicine

## 2022-05-17 ENCOUNTER — Other Ambulatory Visit: Payer: Self-pay | Admitting: Physician Assistant

## 2022-06-08 ENCOUNTER — Other Ambulatory Visit: Payer: Self-pay | Admitting: Physician Assistant

## 2022-06-08 DIAGNOSIS — I10 Essential (primary) hypertension: Secondary | ICD-10-CM

## 2022-08-20 ENCOUNTER — Encounter: Payer: Self-pay | Admitting: Physician Assistant

## 2022-08-20 ENCOUNTER — Ambulatory Visit (INDEPENDENT_AMBULATORY_CARE_PROVIDER_SITE_OTHER): Payer: No Typology Code available for payment source | Admitting: Physician Assistant

## 2022-08-20 VITALS — BP 122/83 | HR 72 | Ht 68.0 in | Wt 201.0 lb

## 2022-08-20 DIAGNOSIS — I10 Essential (primary) hypertension: Secondary | ICD-10-CM

## 2022-08-20 DIAGNOSIS — E1165 Type 2 diabetes mellitus with hyperglycemia: Secondary | ICD-10-CM | POA: Diagnosis not present

## 2022-08-20 DIAGNOSIS — N401 Enlarged prostate with lower urinary tract symptoms: Secondary | ICD-10-CM

## 2022-08-20 DIAGNOSIS — E785 Hyperlipidemia, unspecified: Secondary | ICD-10-CM

## 2022-08-20 DIAGNOSIS — Z23 Encounter for immunization: Secondary | ICD-10-CM

## 2022-08-20 DIAGNOSIS — Z1329 Encounter for screening for other suspected endocrine disorder: Secondary | ICD-10-CM

## 2022-08-20 DIAGNOSIS — R351 Nocturia: Secondary | ICD-10-CM

## 2022-08-20 DIAGNOSIS — Z79899 Other long term (current) drug therapy: Secondary | ICD-10-CM | POA: Diagnosis not present

## 2022-08-20 DIAGNOSIS — M5136 Other intervertebral disc degeneration, lumbar region: Secondary | ICD-10-CM

## 2022-08-20 DIAGNOSIS — E1169 Type 2 diabetes mellitus with other specified complication: Secondary | ICD-10-CM | POA: Diagnosis not present

## 2022-08-20 LAB — POCT GLYCOSYLATED HEMOGLOBIN (HGB A1C): Hemoglobin A1C: 6 % — AB (ref 4.0–5.6)

## 2022-08-20 NOTE — Progress Notes (Signed)
A1C improved.

## 2022-08-21 LAB — CBC WITH DIFFERENTIAL/PLATELET
Absolute Monocytes: 359 cells/uL (ref 200–950)
Basophils Absolute: 29 cells/uL (ref 0–200)
Basophils Relative: 0.5 %
Eosinophils Absolute: 120 cells/uL (ref 15–500)
Eosinophils Relative: 2.1 %
HCT: 42 % (ref 38.5–50.0)
Hemoglobin: 13.9 g/dL (ref 13.2–17.1)
Lymphs Abs: 2525 cells/uL (ref 850–3900)
MCH: 27.8 pg (ref 27.0–33.0)
MCHC: 33.1 g/dL (ref 32.0–36.0)
MCV: 84 fL (ref 80.0–100.0)
MPV: 9.8 fL (ref 7.5–12.5)
Monocytes Relative: 6.3 %
Neutro Abs: 2668 cells/uL (ref 1500–7800)
Neutrophils Relative %: 46.8 %
Platelets: 271 10*3/uL (ref 140–400)
RBC: 5 10*6/uL (ref 4.20–5.80)
RDW: 13.2 % (ref 11.0–15.0)
Total Lymphocyte: 44.3 %
WBC: 5.7 10*3/uL (ref 3.8–10.8)

## 2022-08-21 LAB — COMPLETE METABOLIC PANEL WITH GFR
AG Ratio: 1.7 (calc) (ref 1.0–2.5)
ALT: 14 U/L (ref 9–46)
AST: 14 U/L (ref 10–35)
Albumin: 4.5 g/dL (ref 3.6–5.1)
Alkaline phosphatase (APISO): 77 U/L (ref 35–144)
BUN: 14 mg/dL (ref 7–25)
CO2: 22 mmol/L (ref 20–32)
Calcium: 9.3 mg/dL (ref 8.6–10.3)
Chloride: 108 mmol/L (ref 98–110)
Creat: 0.93 mg/dL (ref 0.70–1.30)
Globulin: 2.6 g/dL (calc) (ref 1.9–3.7)
Glucose, Bld: 79 mg/dL (ref 65–99)
Potassium: 3.9 mmol/L (ref 3.5–5.3)
Sodium: 141 mmol/L (ref 135–146)
Total Bilirubin: 0.5 mg/dL (ref 0.2–1.2)
Total Protein: 7.1 g/dL (ref 6.1–8.1)
eGFR: 99 mL/min/{1.73_m2} (ref >=60)

## 2022-08-21 LAB — LIPID PANEL W/REFLEX DIRECT LDL
Cholesterol: 101 mg/dL (ref <200)
HDL: 34 mg/dL — ABNORMAL LOW (ref >=40)
LDL Cholesterol (Calc): 53 mg/dL (calc)
Non-HDL Cholesterol (Calc): 67 mg/dL (calc) (ref <130)
Total CHOL/HDL Ratio: 3 (calc) (ref <5.0)
Triglycerides: 62 mg/dL (ref <150)

## 2022-08-21 LAB — PSA: PSA: 0.61 ng/mL (ref <=4.00)

## 2022-08-21 LAB — TSH: TSH: 0.92 mIU/L (ref 0.40–4.50)

## 2022-08-23 ENCOUNTER — Other Ambulatory Visit: Payer: Self-pay | Admitting: Physician Assistant

## 2022-08-23 DIAGNOSIS — E1165 Type 2 diabetes mellitus with hyperglycemia: Secondary | ICD-10-CM

## 2022-08-23 DIAGNOSIS — I10 Essential (primary) hypertension: Secondary | ICD-10-CM

## 2022-08-23 DIAGNOSIS — N401 Enlarged prostate with lower urinary tract symptoms: Secondary | ICD-10-CM

## 2022-08-23 MED ORDER — TADALAFIL 5 MG PO TABS: ORAL_TABLET | ORAL | 1 refills | Status: DC

## 2022-08-23 MED ORDER — METFORMIN HCL 500 MG PO TABS: ORAL_TABLET | ORAL | 1 refills | Status: DC

## 2022-08-23 MED ORDER — ATORVASTATIN CALCIUM 80 MG PO TABS: 80.0000 mg | ORAL_TABLET | Freq: Every day | ORAL | 3 refills | Status: DC

## 2022-08-23 MED ORDER — METOPROLOL SUCCINATE ER 50 MG PO TB24: ORAL_TABLET | ORAL | 1 refills | Status: DC

## 2022-08-23 NOTE — Progress Notes (Signed)
Established Patient Office Visit  Subjective   Patient ID: Juan Hensley, male    DOB: Dec 03, 1970  Age: 51 y.o. MRN: [id number]  Chief Complaint  Patient presents with   Follow-up   Weight Loss   Diabetes    HPI Pt is a 51 yo male with HTN, T2DM, HLD who presents to the clinic for follow up in and medication refills.   Pt is doing great. He got dentures recently and has effected his diet some but he is losing weight and feeling better about himself. No CP, palpitations, headaches, or vision changes. He is compliant with medications.   Patient Active Problem List   Diagnosis Date Noted   Cervical spondylosis 04/02/2022   Benign prostatic hyperplasia with nocturia 03/12/2022   Type 2 diabetes mellitus with hyperglycemia, without long-term current use of insulin ([place]) 11/03/2020   Hyperlipidemia associated with type 2 diabetes mellitus ([place]) 11/03/2020   Ingrown toenail 06/27/2020   Diabetes mellitus without complication ([place]) 42/70/6237   Elevated LDL cholesterol level 03/27/2019   Submental mass 03/27/2019   Ganglion cyst 03/27/2019   Tobacco dependence 01/14/2015   Essential hypertension, benign 01/09/2015   Primary osteoarthritis of left elbow 12/20/2014   Lumbar degenerative disc disease 10/30/2014   Past Medical History:  Diagnosis Date   Arthritis    Diabetes mellitus without complication (HCC)    Elevated fasting glucose 03/27/2019   Hyperlipidemia    Hypertension    Sinus disease    Family History  Problem Relation Age of Onset   Hypertension Mother    Diabetes Mother    Healthy Father    Colon cancer Neg Hx    Colon polyps Neg Hx    Esophageal cancer Neg Hx    Stomach cancer Neg Hx    Rectal cancer Neg Hx    Allergies  Allergen Reactions   Lisinopril Swelling    Angioedema of lips 02/06/2015   Penicillins Other (See Comments)    Had reaction as a baby; unaware of what reaction was.   Cephalexin Swelling      Review of Systems  All other systems  reviewed and are negative.     Objective:     BP 122/83   Pulse 72   Ht '5\' 8"'$  (1.727 m)   Wt 201 lb (91.2 kg)   SpO2 100%   BMI 30.56 kg/m  BP Readings from Last 3 Encounters:  08/20/22 122/83  03/11/22 132/82  05/06/21 (!) 174/108   Wt Readings from Last 3 Encounters:  08/20/22 201 lb (91.2 kg)  03/11/22 228 lb (103.4 kg)  05/06/21 224 lb (101.6 kg)    .Marland Kitchen Lab Results  Component Value Date   HGBA1C 6.0 (A) 08/20/2022     Physical Exam Constitutional:      Appearance: Normal appearance. He is obese.  HENT:     Head: Normocephalic.  Neck:     Vascular: No carotid bruit.  Cardiovascular:     Rate and Rhythm: Normal rate and regular rhythm.     Pulses: Normal pulses.  Pulmonary:     Effort: Pulmonary effort is normal.     Breath sounds: Normal breath sounds.  Musculoskeletal:     Right lower leg: No edema.     Left lower leg: No edema.  Neurological:     General: No focal deficit present.     Mental Status: He is alert.  Psychiatric:        Mood and Affect: Mood normal.  Assessment & Plan:  Marland KitchenMarland KitchenNemiah was seen today for follow-up, weight loss and diabetes.  Diagnoses and all orders for this visit:  Type 2 diabetes mellitus with hyperglycemia, without long-term current use of insulin (HCC) -     COMPLETE METABOLIC PANEL WITH GFR -     POCT glycosylated hemoglobin (Hb A1C) -     Ambulatory referral to Ophthalmology  Essential hypertension, benign -     COMPLETE METABOLIC PANEL WITH GFR  Hyperlipidemia associated with type 2 diabetes mellitus ([place]) -     Lipid Panel w/reflex Direct LDL  Benign prostatic hyperplasia with nocturia -     PSA  Thyroid disorder screen -     TSH  Medication management -     PSA -     TSH -     Lipid Panel w/reflex Direct LDL -     COMPLETE METABOLIC PANEL WITH GFR -     CBC with Differential/Platelet -     POCT glycosylated hemoglobin (Hb A1C)  Encounter for immunization [employer] Fall 2023 Covid-19  Vaccine 52yr and older   A1C to goal.  Fasting labs ordered Metformin refilled BP to goal Needs eye exam and referral placed today Flu shot UTD Covid vaccine given today Follow up in 3 months   JIran Planas PA-C

## 2022-08-23 NOTE — Progress Notes (Signed)
Denise,   Kidney, liver, glucose looks great.  Thyroid looks great.  PSA low and stable.  LDL looks great.  HDL on the low side. Exercise would help with this.   Stay on same medications.

## 2022-09-08 LAB — HM DIABETES EYE EXAM

## 2022-09-09 ENCOUNTER — Encounter: Payer: Self-pay | Admitting: Podiatry

## 2022-09-09 ENCOUNTER — Ambulatory Visit (INDEPENDENT_AMBULATORY_CARE_PROVIDER_SITE_OTHER): Payer: No Typology Code available for payment source | Admitting: Podiatry

## 2022-09-09 DIAGNOSIS — L600 Ingrowing nail: Secondary | ICD-10-CM

## 2022-09-09 NOTE — Patient Instructions (Signed)
Place 1/4 cup of epsom salts in a quart of warm tap water.  Submerge your foot or feet in the solution and soak for 20 minutes.  This soak should be done twice a day.  Next, remove your foot or feet from solution, blot dry the affected area. Apply ointment and cover if instructed by your doctor.   IF YOUR SKIN BECOMES IRRITATED WHILE USING THESE INSTRUCTIONS, IT IS OKAY TO SWITCH TO  [employer]udo VINEGAR AND WATER.  As another alternative soak, you may use antibacterial soap and water.  Monitor for any signs/symptoms of infection. Call the office immediately if any occur or go directly to the emergency room. Call with any questions/concerns.  

## 2022-09-10 ENCOUNTER — Ambulatory Visit: Payer: Self-pay | Admitting: Physician Assistant

## 2022-09-10 NOTE — Progress Notes (Signed)
Subjective:   Patient ID: Juan Hensley, male   DOB: 51 y.o.   MRN: [id number]   HPI Patient presents with a painful ingrown toenail of the left hallux lateral border that is been very sore he is tried to trim and soak it without relief and it has been difficult to wear shoe gear   ROS      Objective:  Physical Exam  Neurovascular status intact incurvated left hallux lateral border no drainage noted mild erythema surrounding the area with pain with pressure     Assessment:  Significant acute ingrown toenail deformity left hallux no current indication infection     Plan:  Recommended removal of the border explained procedure risk patient wants surgery signed consent form and today I infiltrated 60 mg like Marcaine mixture sterile prep done using sterile instrumentation remove the border exposed matrix applied phenol 3 applications 30 seconds followed by alcohol lavage sterile dressing gave instructions on soaks and to wear dressing 24 hours take it off earlier if throbbing were to occur encouraged her him to call with questions concerns

## 2022-12-06 ENCOUNTER — Ambulatory Visit (INDEPENDENT_AMBULATORY_CARE_PROVIDER_SITE_OTHER): Payer: No Typology Code available for payment source | Admitting: Physician Assistant

## 2022-12-06 ENCOUNTER — Encounter: Payer: Self-pay | Admitting: Physician Assistant

## 2022-12-06 VITALS — BP 137/88 | HR 70 | Temp 98.4°F | Ht 68.0 in | Wt 186.0 lb

## 2022-12-06 DIAGNOSIS — R634 Abnormal weight loss: Secondary | ICD-10-CM | POA: Diagnosis not present

## 2022-12-06 DIAGNOSIS — J0100 Acute maxillary sinusitis, unspecified: Secondary | ICD-10-CM

## 2022-12-06 DIAGNOSIS — E1165 Type 2 diabetes mellitus with hyperglycemia: Secondary | ICD-10-CM

## 2022-12-06 MED ORDER — AZITHROMYCIN 250 MG PO TABS: ORAL_TABLET | ORAL | 0 refills | Status: DC

## 2022-12-06 MED ORDER — METHYLPREDNISOLONE 4 MG PO TBPK: ORAL_TABLET | ORAL | 0 refills | Status: DC

## 2022-12-06 NOTE — Patient Instructions (Addendum)
Start zpak and medrol dose pack  Get labs for weight loss recheck in 1 month Consider meal replacement shakes if not hungry or does not want to eat food  Sinus Infection, Adult A sinus infection, also called sinusitis, is inflammation of your sinuses. Sinuses are hollow spaces in the bones around your face. Your sinuses are located: Around your eyes. In the middle of your forehead. Behind your nose. In your cheekbones. Mucus normally drains out of your sinuses. When your nasal tissues become inflamed or swollen, mucus can become trapped or blocked. This allows bacteria, viruses, and fungi to grow, which leads to infection. Most infections of the sinuses are caused by a virus. A sinus infection can develop quickly. It can last for up to 4 weeks (acute) or for more than 12 weeks (chronic). A sinus infection often develops after a cold. What are the causes? This condition is caused by anything that creates swelling in the sinuses or stops mucus from draining. This includes: Allergies. Asthma. Infection from bacteria or viruses. Deformities or blockages in your nose or sinuses. Abnormal growths in the nose (nasal polyps). Pollutants, such as chemicals or irritants in the air. Infection from fungi. This is rare. What increases the risk? You are more likely to develop this condition if you: Have a weak body defense system (immune system). Do a lot of swimming or diving. Overuse nasal sprays. Smoke. What are the signs or symptoms? The main symptoms of this condition are pain and a feeling of pressure around the affected sinuses. Other symptoms include: Stuffy nose or congestion that makes it difficult to breathe through your nose. Thick yellow or greenish drainage from your nose. Tenderness, swelling, and warmth over the affected sinuses. A cough that may get worse at night. Decreased sense of smell and taste. Extra mucus that collects in the throat or the back of the nose (postnasal  drip) causing a sore throat or bad breath. Tiredness (fatigue). Fever. How is this diagnosed? This condition is diagnosed based on: Your symptoms. Your medical history. A physical exam. Tests to find out if your condition is acute or chronic. This may include: Checking your nose for nasal polyps. Viewing your sinuses using a device that has a light (endoscope). Testing for allergies or bacteria. Imaging tests, such as an MRI or CT scan. In rare cases, a bone biopsy may be done to rule out more serious types of fungal sinus disease. How is this treated? Treatment for a sinus infection depends on the cause and whether your condition is chronic or acute. If caused by a virus, your symptoms should go away on their own within 10 days. You may be given medicines to relieve symptoms. They include: Medicines that shrink swollen nasal passages (decongestants). A spray that eases inflammation of the nostrils (topical intranasal corticosteroids). Rinses that help get rid of thick mucus in your nose (nasal saline washes). Medicines that treat allergies (antihistamines). Over-the-counter pain relievers. If caused by bacteria, your health care provider may recommend waiting to see if your symptoms improve. Most bacterial infections will get better without antibiotic medicine. You may be given antibiotics if you have: A severe infection. A weak immune system. If caused by narrow nasal passages or nasal polyps, surgery may be needed. Follow these instructions at home: Medicines Take, use, or apply over-the-counter and prescription medicines only as told by your health care provider. These may include nasal sprays. If you were prescribed an antibiotic medicine, take it as told by your health care  provider. Do not stop taking the antibiotic even if you start to feel better. Hydrate and humidify  Drink enough fluid to keep your urine pale yellow. Staying hydrated will help to thin your mucus. Use a  cool mist humidifier to keep the humidity level in your home above 50%. Inhale steam for 10-15 minutes, 3-4 times a day, or as told by your health care provider. You can do this in the bathroom while a hot shower is running. Limit your exposure to cool or dry air. Rest Rest as much as possible. Sleep with your head raised (elevated). Make sure you get enough sleep each night. General instructions  Apply a warm, moist washcloth to your face 3-4 times a day or as told by your health care provider. This will help with discomfort. Use nasal saline washes as often as told by your health care provider. Wash your hands often with soap and water to reduce your exposure to germs. If soap and water are not available, use hand sanitizer. Do not smoke. Avoid being around people who are smoking (secondhand smoke). Keep all follow-up visits. This is important. Contact a health care provider if: You have a fever. Your symptoms get worse. Your symptoms do not improve within 10 days. Get help right away if: You have a severe headache. You have persistent vomiting. You have severe pain or swelling around your face or eyes. You have vision problems. You develop confusion. Your neck is stiff. You have trouble breathing. These symptoms may be an emergency. Get help right away. Call 911. Do not wait to see if the symptoms will go away. Do not drive yourself to the hospital. Summary A sinus infection is soreness and inflammation of your sinuses. Sinuses are hollow spaces in the bones around your face. This condition is caused by nasal tissues that become inflamed or swollen. The swelling traps or blocks the flow of mucus. This allows bacteria, viruses, and fungi to grow, which leads to infection. If you were prescribed an antibiotic medicine, take it as told by your health care provider. Do not stop taking the antibiotic even if you start to feel better. Keep all follow-up visits. This is important. This  information is not intended to replace advice given to you by your health care provider. Make sure you discuss any questions you have with your health care provider. Document Revised: 09/01/2021 Document Reviewed: 09/01/2021 Elsevier Patient Education  [employer].

## 2022-12-06 NOTE — Progress Notes (Signed)
Acute Office Visit  Subjective:     Patient ID: Juan Hensley, male    DOB: Feb 21, 1971, 52 y.o.   MRN: [id number]  Chief Complaint  Patient presents with   Sinus Problem   Weight Loss    HPI Patient is in today for left sided sinus pressure, congestion, headache, watery eyes for the last 2 weeks. He had the flu about 4 weeks ago and most of his symptoms cleared except the sinus symptoms. He is taking OTC medications but does not seem to be helping. No current fever, chills, body aches. He does have some cough but not terrible. No SOB.   His wife is worried about his weight loss. He is down to 186 from 205 last visit. He admits after getting dentures he has not been eating as much. Eating is harder and not as enjoyable so he does not do it. He usually eats one meal a day.   .. Active Ambulatory Problems    Diagnosis Date Noted   Lumbar degenerative disc disease 10/30/2014   Primary osteoarthritis of left elbow 12/20/2014   Essential hypertension, benign 01/09/2015   Tobacco dependence 01/14/2015   Elevated LDL cholesterol level 03/27/2019   Submental mass 03/27/2019   Ganglion cyst 03/27/2019   Diabetes mellitus without complication ([place]) [id number]   Ingrown toenail 06/27/2020   Type 2 diabetes mellitus with hyperglycemia, without long-term current use of insulin ([place]) 11/03/2020   Hyperlipidemia associated with type 2 diabetes mellitus ([place]) 11/03/2020   Benign prostatic hyperplasia with nocturia 03/12/2022   Cervical spondylosis 04/02/2022   Weight loss 12/06/2022   Resolved Ambulatory Problems    Diagnosis Date Noted   Lumbar radiculopathy 10/31/2018   Elevated fasting glucose 03/27/2019   Past Medical History:  Diagnosis Date   Arthritis    Hyperlipidemia    Hypertension    Sinus disease      ROS  See HPI.     Objective:    BP 137/88   Pulse 70   Temp 98.4 F (36.9 C) (Oral)   Ht '5\' 8"'$  (1.727 m)   Wt 186 lb (84.4 kg)   SpO2 100%   BMI 28.28 kg/m   BP Readings from Last 3 Encounters:  12/06/22 137/88  08/20/22 122/83  03/11/22 132/82   Wt Readings from Last 3 Encounters:  12/06/22 186 lb (84.4 kg)  08/20/22 201 lb (91.2 kg)  03/11/22 228 lb (103.4 kg)      Physical Exam Constitutional:      Appearance: Normal appearance.  HENT:     Head: Normocephalic.     Right Ear: Tympanic membrane, ear canal and external ear normal. There is no impacted cerumen.     Left Ear: Tympanic membrane, ear canal and external ear normal. There is no impacted cerumen.     Nose: Nose normal.     Comments: Swollen and erythematous nasal turbinates with rhinorrhea present    Mouth/Throat:     Mouth: Mucous membranes are moist.     Pharynx: No oropharyngeal exudate or posterior oropharyngeal erythema.  Eyes:     General:        Right eye: No discharge.        Left eye: Discharge present.    Conjunctiva/sclera: Conjunctivae normal.     Comments: Watery eye  Cardiovascular:     Rate and Rhythm: Normal rate and regular rhythm.  Pulmonary:     Effort: Pulmonary effort is normal.     Breath sounds: Normal breath sounds.  Musculoskeletal:     Cervical back: No tenderness.  Lymphadenopathy:     Cervical: No cervical adenopathy.  Neurological:     General: No focal deficit present.     Mental Status: He is alert and oriented to person, place, and time.  Psychiatric:        Mood and Affect: Mood normal.          Assessment & Plan:  Marland KitchenMarland KitchenSanthosh was seen today for sinus problem and weight loss.  Diagnoses and all orders for this visit:  Acute non-recurrent maxillary sinusitis -     azithromycin (ZITHROMAX Z-PAK) 250 MG tablet; Take 2 tablets (500 mg) on  Day 1,  followed by 1 tablet (250 mg) once daily on Days 2 through 5. -     methylPREDNISolone (MEDROL DOSEPAK) 4 MG TBPK tablet; Take as directed by package insert.  Type 2 diabetes mellitus with hyperglycemia, without long-term current use of insulin (HCC) -     Hemoglobin A1c  Weight  loss -     COMPLETE METABOLIC PANEL WITH GFR -     CBC w/Diff/Platelet -     TSH   Zpak and medrol dose pack for sinusitis Follow up as needed if symptoms persist or worsen  A1C added in labs for DM management  Pt is just not eating after dentures Discussed importance of keeping a healthy diet Consider meal replacement shakes for nutrition BMI still looks good today     Return in about 4 weeks (around 01/03/2023) for weight check.  Iran Planas, PA-C

## 2022-12-13 ENCOUNTER — Other Ambulatory Visit: Payer: Self-pay | Admitting: Physician Assistant

## 2022-12-13 DIAGNOSIS — I10 Essential (primary) hypertension: Secondary | ICD-10-CM

## 2022-12-15 ENCOUNTER — Telehealth: Payer: Self-pay | Admitting: Neurology

## 2022-12-15 NOTE — Telephone Encounter (Signed)
Patient' wife called and LVM stating patient finished steroids and antibiotics and still having sinus issues. Please advise.

## 2022-12-16 NOTE — Telephone Encounter (Signed)
Patient made aware of recommendations.

## 2023-01-03 ENCOUNTER — Ambulatory Visit: Payer: No Typology Code available for payment source | Admitting: Physician Assistant

## 2023-02-18 ENCOUNTER — Ambulatory Visit: Payer: No Typology Code available for payment source | Admitting: Physician Assistant

## 2023-02-18 ENCOUNTER — Encounter: Payer: Self-pay | Admitting: Physician Assistant

## 2023-02-18 VITALS — BP 111/68 | HR 69

## 2023-02-18 DIAGNOSIS — R0981 Nasal congestion: Secondary | ICD-10-CM

## 2023-02-18 DIAGNOSIS — M5136 Other intervertebral disc degeneration, lumbar region: Secondary | ICD-10-CM

## 2023-02-18 DIAGNOSIS — E1165 Type 2 diabetes mellitus with hyperglycemia: Secondary | ICD-10-CM | POA: Diagnosis not present

## 2023-02-18 DIAGNOSIS — I10 Essential (primary) hypertension: Secondary | ICD-10-CM

## 2023-02-18 DIAGNOSIS — E1169 Type 2 diabetes mellitus with other specified complication: Secondary | ICD-10-CM | POA: Diagnosis not present

## 2023-02-18 DIAGNOSIS — M5416 Radiculopathy, lumbar region: Secondary | ICD-10-CM

## 2023-02-18 DIAGNOSIS — E785 Hyperlipidemia, unspecified: Secondary | ICD-10-CM

## 2023-02-18 DIAGNOSIS — J0140 Acute pansinusitis, unspecified: Secondary | ICD-10-CM

## 2023-02-18 DIAGNOSIS — Z7984 Long term (current) use of oral hypoglycemic drugs: Secondary | ICD-10-CM

## 2023-02-18 LAB — POCT GLYCOSYLATED HEMOGLOBIN (HGB A1C)
HbA1c, POC (controlled diabetic range): 5.6 % (ref 0.0–7.0)
Hemoglobin A1C: 5.6 % (ref 4.0–5.6)

## 2023-02-18 LAB — POCT UA - MICROALBUMIN
Albumin/Creatinine Ratio, Urine, POC: <30
Creatinine, POC: 200 mg/dL
Microalbumin Ur, POC: 30 mg/L

## 2023-02-18 MED ORDER — METFORMIN HCL 500 MG PO TABS: ORAL_TABLET | ORAL | 1 refills | Status: DC

## 2023-02-18 MED ORDER — CYCLOBENZAPRINE HCL 10 MG PO TABS: ORAL_TABLET | ORAL | 1 refills | Status: DC

## 2023-02-18 MED ORDER — MELOXICAM 15 MG PO TABS: ORAL_TABLET | ORAL | 3 refills | Status: DC

## 2023-02-18 MED ORDER — DOXYCYCLINE HYCLATE 100 MG PO TABS: 100.0000 mg | ORAL_TABLET | Freq: Two times a day (BID) | ORAL | 0 refills | Status: DC

## 2023-02-18 NOTE — Patient Instructions (Addendum)
Cut back norvasc to 1/2 tablet goal BP under 130/80 Astepro one spray each nostril twice a day Start doxycycline for 10 days

## 2023-02-18 NOTE — Progress Notes (Signed)
   Established Patient Office Visit  Subjective   Patient ID: Juan Hensley, male    DOB: Nov 23, 1970  Age: 52 y.o. MRN: [id number]  No chief complaint on file.   HPI  {History (Optional):23778}  ROS    Objective:     There were no vitals taken for this visit. {Vitals History (Optional):23777}  Physical Exam   No results found for any visits on 02/18/23.  {Labs (Optional):23779}  The ASCVD Risk score (Arnett DK, et al., 2019) failed to calculate for the following reasons:   The valid total cholesterol range is 130 to 320 mg/dL    Assessment & Plan:   Problem List Items Addressed This Visit   None   No follow-ups on file.    Tandy Gaw, PA-C

## 2023-03-12 DIAGNOSIS — Z419 Encounter for procedure for purposes other than remedying health state, unspecified: Secondary | ICD-10-CM | POA: Diagnosis not present

## 2023-03-31 ENCOUNTER — Ambulatory Visit (INDEPENDENT_AMBULATORY_CARE_PROVIDER_SITE_OTHER): Payer: Medicaid Other | Admitting: Sports Medicine

## 2023-03-31 ENCOUNTER — Ambulatory Visit (INDEPENDENT_AMBULATORY_CARE_PROVIDER_SITE_OTHER): Payer: Medicaid Other

## 2023-03-31 DIAGNOSIS — M47812 Spondylosis without myelopathy or radiculopathy, cervical region: Secondary | ICD-10-CM | POA: Diagnosis not present

## 2023-03-31 DIAGNOSIS — M5136 Other intervertebral disc degeneration, lumbar region: Secondary | ICD-10-CM

## 2023-03-31 DIAGNOSIS — M47816 Spondylosis without myelopathy or radiculopathy, lumbar region: Secondary | ICD-10-CM | POA: Diagnosis not present

## 2023-03-31 DIAGNOSIS — M51369 Other intervertebral disc degeneration, lumbar region without mention of lumbar back pain or lower extremity pain: Secondary | ICD-10-CM

## 2023-03-31 MED ORDER — IBUPROFEN 200 MG PO TABS
800.0000 mg | ORAL_TABLET | Freq: Three times a day (TID) | ORAL | Status: AC
Start: 2023-03-31 — End: ?

## 2023-03-31 MED ORDER — METHYLPREDNISOLONE SODIUM SUCC 125 MG IJ SOLR
125.0000 mg | Freq: Once | INTRAMUSCULAR | Status: AC
Start: 2023-03-31 — End: 2023-03-31
  Administered 2023-03-31: 125 mg via INTRAMUSCULAR

## 2023-03-31 MED ORDER — PREDNISONE 50 MG PO TABS: ORAL_TABLET | ORAL | 0 refills | Status: DC

## 2023-03-31 MED ORDER — GABAPENTIN 300 MG PO CAPS: ORAL_CAPSULE | ORAL | 3 refills | Status: AC

## 2023-03-31 MED ORDER — KETOROLAC TROMETHAMINE 30 MG/ML IJ SOLN
30.0000 mg | Freq: Once | INTRAMUSCULAR | Status: AC
Start: 2023-03-31 — End: 2023-03-31
  Administered 2023-03-31: 30 mg via INTRAMUSCULAR

## 2023-03-31 NOTE — Assessment & Plan Note (Signed)
Also known lumbar DDD, he does have L3-L4 disc protrusions and left-sided L4-S1 facet arthritis, he had an L3-L4 interlaminar epidural on the left in 2016 with 100% pain relief, we treated him conservatively back in June, and never saw him back. We will again start conservative treatment with prednisone, Neurontin, he can do ibuprofen at home. If insufficient improvement we will proceed with repeat left L3-L4 interlaminar epidural.

## 2023-03-31 NOTE — Progress Notes (Signed)
    Procedures performed today:    None.  Independent interpretation of notes and tests performed by another provider:   None.  Brief History, Exam, Impression, and Recommendations:    Cervical spondylosis Pleasant 52 year old male, chronic axial neck pain, last treated June 2023, we did prednisone, meloxicam, home conditioning, I never saw him back after that, it sounds like he did get significantly better but did not really do the home conditioning. He has a recurrence of pain, axial neck with left-sided periscapular radicular symptoms, occasional radiation to the right arm as well. Today I explained the rationale behind aggressive conditioning and therapy, we will restart prednisone, I will give him Toradol and Solu-Medrol today, Neurontin. He can use ibuprofen 800mg  3 times daily as needed, return to see me in about 6 weeks, we will do an MRI for procedural planning if not better.  Lumbar degenerative disc disease Also known lumbar DDD, he does have L3-L4 disc protrusions and left-sided L4-S1 facet arthritis, he had an L3-L4 interlaminar epidural on the left in 2016 with 100% pain relief, we treated him conservatively back in June, and never saw him back. We will again start conservative treatment with prednisone, Neurontin, he can do ibuprofen at home. If insufficient improvement we will proceed with repeat left L3-L4 interlaminar epidural.    ____________________________________________ Ihor Austin. Benjamin Stain, M.D., ABFM., CAQSM., AME. Primary Care and Sports Medicine [facility]  Adjunct Professor of Family Medicine  [employer]  Restaurant manager, fast food

## 2023-03-31 NOTE — Addendum Note (Signed)
Addended by: Carren Rang A on: 03/31/2023 09:56 AM   Modules accepted: Orders

## 2023-03-31 NOTE — Assessment & Plan Note (Signed)
Pleasant 52 year old male, chronic axial neck pain, last treated June 2023, we did prednisone, meloxicam, home conditioning, I never saw him back after that, it sounds like he did get significantly better but did not really do the home conditioning. He has a recurrence of pain, axial neck with left-sided periscapular radicular symptoms, occasional radiation to the right arm as well. Today I explained the rationale behind aggressive conditioning and therapy, we will restart prednisone, I will give him Toradol and Solu-Medrol today, Neurontin. He can use ibuprofen 800mg  3 times daily as needed, return to see me in about 6 weeks, we will do an MRI for procedural planning if not better.

## 2023-04-02 ENCOUNTER — Other Ambulatory Visit: Payer: Self-pay | Admitting: Physician Assistant

## 2023-04-02 DIAGNOSIS — N401 Enlarged prostate with lower urinary tract symptoms: Secondary | ICD-10-CM

## 2023-04-11 DIAGNOSIS — Z419 Encounter for procedure for purposes other than remedying health state, unspecified: Secondary | ICD-10-CM | POA: Diagnosis not present
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
## 2023-04-12 ENCOUNTER — Encounter: Payer: Self-pay | Admitting: Rehabilitative and Restorative Service Providers"

## 2023-04-12 ENCOUNTER — Other Ambulatory Visit: Payer: Self-pay

## 2023-04-12 ENCOUNTER — Ambulatory Visit: Payer: Medicaid Other | Attending: Sports Medicine | Admitting: Rehabilitative and Restorative Service Providers"

## 2023-04-12 DIAGNOSIS — M6281 Muscle weakness (generalized): Secondary | ICD-10-CM | POA: Diagnosis present

## 2023-04-12 DIAGNOSIS — M5459 Other low back pain: Secondary | ICD-10-CM | POA: Diagnosis present

## 2023-04-12 DIAGNOSIS — R293 Abnormal posture: Secondary | ICD-10-CM

## 2023-04-12 DIAGNOSIS — M542 Cervicalgia: Secondary | ICD-10-CM | POA: Diagnosis present

## 2023-04-12 DIAGNOSIS — M47812 Spondylosis without myelopathy or radiculopathy, cervical region: Secondary | ICD-10-CM | POA: Insufficient documentation

## 2023-04-12 DIAGNOSIS — R29898 Other symptoms and signs involving the musculoskeletal system: Secondary | ICD-10-CM | POA: Diagnosis not present

## 2023-04-26 ENCOUNTER — Encounter: Payer: Self-pay | Admitting: Rehabilitative and Restorative Service Providers"

## 2023-04-26 ENCOUNTER — Ambulatory Visit: Payer: Medicaid Other | Admitting: Rehabilitative and Restorative Service Providers"

## 2023-04-26 DIAGNOSIS — M542 Cervicalgia: Secondary | ICD-10-CM

## 2023-04-26 DIAGNOSIS — M47812 Spondylosis without myelopathy or radiculopathy, cervical region: Secondary | ICD-10-CM | POA: Diagnosis not present

## 2023-04-26 DIAGNOSIS — M6281 Muscle weakness (generalized): Secondary | ICD-10-CM | POA: Diagnosis not present

## 2023-04-26 DIAGNOSIS — M5459 Other low back pain: Secondary | ICD-10-CM

## 2023-04-26 DIAGNOSIS — R29898 Other symptoms and signs involving the musculoskeletal system: Secondary | ICD-10-CM

## 2023-04-26 DIAGNOSIS — R293 Abnormal posture: Secondary | ICD-10-CM | POA: Diagnosis not present
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
## 2023-04-28 ENCOUNTER — Encounter: Payer: Self-pay | Admitting: Rehabilitative and Restorative Service Providers"

## 2023-04-28 ENCOUNTER — Ambulatory Visit: Payer: Medicaid Other | Admitting: Rehabilitative and Restorative Service Providers"

## 2023-04-28 DIAGNOSIS — R29898 Other symptoms and signs involving the musculoskeletal system: Secondary | ICD-10-CM | POA: Diagnosis not present

## 2023-04-28 DIAGNOSIS — M542 Cervicalgia: Secondary | ICD-10-CM | POA: Diagnosis not present

## 2023-04-28 DIAGNOSIS — M5459 Other low back pain: Secondary | ICD-10-CM | POA: Diagnosis not present

## 2023-04-28 DIAGNOSIS — M6281 Muscle weakness (generalized): Secondary | ICD-10-CM

## 2023-04-28 DIAGNOSIS — R293 Abnormal posture: Secondary | ICD-10-CM

## 2023-04-28 DIAGNOSIS — M47812 Spondylosis without myelopathy or radiculopathy, cervical region: Secondary | ICD-10-CM | POA: Diagnosis not present
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
## 2023-05-03 ENCOUNTER — Ambulatory Visit: Payer: Medicaid Other | Admitting: Rehabilitative and Restorative Service Providers"

## 2023-05-03 ENCOUNTER — Encounter: Payer: Self-pay | Admitting: Rehabilitative and Restorative Service Providers"

## 2023-05-03 DIAGNOSIS — R29898 Other symptoms and signs involving the musculoskeletal system: Secondary | ICD-10-CM | POA: Diagnosis not present

## 2023-05-03 DIAGNOSIS — R293 Abnormal posture: Secondary | ICD-10-CM

## 2023-05-03 DIAGNOSIS — M5459 Other low back pain: Secondary | ICD-10-CM | POA: Diagnosis not present

## 2023-05-03 DIAGNOSIS — M6281 Muscle weakness (generalized): Secondary | ICD-10-CM | POA: Diagnosis not present

## 2023-05-03 DIAGNOSIS — M542 Cervicalgia: Secondary | ICD-10-CM | POA: Diagnosis not present

## 2023-05-03 DIAGNOSIS — M47812 Spondylosis without myelopathy or radiculopathy, cervical region: Secondary | ICD-10-CM | POA: Diagnosis not present
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
## 2023-05-05 ENCOUNTER — Encounter: Payer: Self-pay | Admitting: Physical Therapy

## 2023-05-05 ENCOUNTER — Ambulatory Visit: Payer: Medicaid Other | Admitting: Physical Therapy

## 2023-05-05 DIAGNOSIS — R29898 Other symptoms and signs involving the musculoskeletal system: Secondary | ICD-10-CM | POA: Diagnosis not present

## 2023-05-05 DIAGNOSIS — R293 Abnormal posture: Secondary | ICD-10-CM | POA: Diagnosis not present

## 2023-05-05 DIAGNOSIS — M5459 Other low back pain: Secondary | ICD-10-CM

## 2023-05-05 DIAGNOSIS — M542 Cervicalgia: Secondary | ICD-10-CM

## 2023-05-05 DIAGNOSIS — M47812 Spondylosis without myelopathy or radiculopathy, cervical region: Secondary | ICD-10-CM | POA: Diagnosis not present

## 2023-05-05 DIAGNOSIS — M6281 Muscle weakness (generalized): Secondary | ICD-10-CM | POA: Diagnosis not present

## 2023-05-06 ENCOUNTER — Other Ambulatory Visit: Payer: Self-pay | Admitting: Physician Assistant

## 2023-05-06 DIAGNOSIS — J0140 Acute pansinusitis, unspecified: Secondary | ICD-10-CM

## 2023-05-06 NOTE — Telephone Encounter (Signed)
Patient's wife called she asked if a prescription could be called in for a sinus infection he was seen for this in May he has returned Please advise [phone]

## 2023-05-09 ENCOUNTER — Other Ambulatory Visit: Payer: Self-pay | Admitting: Physician Assistant

## 2023-05-09 DIAGNOSIS — I10 Essential (primary) hypertension: Secondary | ICD-10-CM

## 2023-05-09 NOTE — Telephone Encounter (Signed)
We have to document this as a new infection. Virtual or in person visit needed.

## 2023-05-10 ENCOUNTER — Encounter: Payer: Medicaid Other | Admitting: Physical Therapy

## 2023-05-10 ENCOUNTER — Telehealth (INDEPENDENT_AMBULATORY_CARE_PROVIDER_SITE_OTHER): Payer: Medicaid Other | Admitting: Physician Assistant

## 2023-05-10 ENCOUNTER — Ambulatory Visit: Payer: Medicaid Other | Admitting: Physician Assistant

## 2023-05-10 ENCOUNTER — Encounter: Payer: Self-pay | Admitting: Physician Assistant

## 2023-05-10 VITALS — Ht 68.0 in | Wt 172.0 lb

## 2023-05-10 DIAGNOSIS — J40 Bronchitis, not specified as acute or chronic: Secondary | ICD-10-CM | POA: Diagnosis not present

## 2023-05-10 DIAGNOSIS — J329 Chronic sinusitis, unspecified: Secondary | ICD-10-CM | POA: Diagnosis not present

## 2023-05-10 MED ORDER — DOXYCYCLINE HYCLATE 100 MG PO TABS
100.0000 mg | ORAL_TABLET | Freq: Two times a day (BID) | ORAL | 0 refills | Status: DC
Start: 2023-05-10 — End: 2023-09-07

## 2023-05-10 MED ORDER — FLUTICASONE PROPIONATE 50 MCG/ACT NA SUSP: 2.0000 | Freq: Every day | NASAL | 1 refills | Status: DC

## 2023-05-10 NOTE — Telephone Encounter (Signed)
Patient scheduled for today 05/10/23 virtual appointment, thanks.

## 2023-05-10 NOTE — Progress Notes (Signed)
..  Virtual Visit via Video Note  I connected with DOAK SHROCK on 05/10/23 at  1:00 PM EDT by a video enabled telemedicine application and verified that I am speaking with the correct person using two identifiers.  Location: Patient: home Provider: clinic  .Marland KitchenParticipating in visit:  Patient: Brit and Alvy Beal his wife Provider: Tandy Gaw PA-C   I discussed the limitations of evaluation and management by telemedicine and the availability of in person appointments. The patient expressed understanding and agreed to proceed.  History of Present Illness: Pt is a 52 yo male with sinus pressure and cough since last week about 6 days ago. His symptoms started first and then wife got it. No fever, chills, SOB. He has had productive cough, sinus pressure and congestion. He has some yellow sputum. Hx of sinus infections in past and requesting doxycycline.   .. Active Ambulatory Problems    Diagnosis Date Noted   Lumbar degenerative disc disease 10/30/2014   Primary osteoarthritis of left elbow 12/20/2014   Essential hypertension, benign 01/09/2015   Tobacco dependence 01/14/2015   Elevated LDL cholesterol level 03/27/2019   Submental mass 03/27/2019   Ganglion cyst 03/27/2019   Diabetes mellitus without complication (HCC) 02/05/2020   Ingrown toenail 06/27/2020   Type 2 diabetes mellitus with hyperglycemia, without long-term current use of insulin (HCC) 11/03/2020   Hyperlipidemia associated with type 2 diabetes mellitus (HCC) 11/03/2020   Benign prostatic hyperplasia with nocturia 03/12/2022   Cervical spondylosis 04/02/2022   Weight loss 12/06/2022   Subacute pansinusitis 02/18/2023   Resolved Ambulatory Problems    Diagnosis Date Noted   Lumbar radiculopathy 10/31/2018   Elevated fasting glucose 03/27/2019   Past Medical History:  Diagnosis Date   Arthritis    Hyperlipidemia    Hypertension    Sinus disease        Observations/Objective: No acute distress Normal  breathing Normal mood and appearance  Wife in background with hacking cough and laying on cough   Assessment and Plan: Marland KitchenMarland KitchenPatrick was seen today for sinusitis.  Diagnoses and all orders for this visit:  Sinobronchitis -     doxycycline (VIBRA-TABS) 100 MG tablet; Take 1 tablet (100 mg total) by mouth 2 (two) times daily. -     fluticasone (FLONASE) 50 MCG/ACT nasal spray; Place 2 sprays into both nostrils daily.   I believe he will continue to get better without abx Use mucinex D and flonase and give it at least 2 more days Discussed need to let body work and only use doxycycline if not improving or worsening Follow up as needed    Follow Up Instructions:    I discussed the assessment and treatment plan with the patient. The patient was provided an opportunity to ask questions and all were answered. The patient agreed with the plan and demonstrated an understanding of the instructions.   The patient was advised to call back or seek an in-person evaluation if the symptoms worsen or if the condition fails to improve as anticipated.    Tandy Gaw, PA-C

## 2023-05-10 NOTE — Progress Notes (Signed)
Called pt at 10:47am  to go  over symptoms pt can not recall how long he has has symptoms pt states that he normally has his wife speak on his behalf because he can not remember.

## 2023-05-12 ENCOUNTER — Encounter: Payer: Medicaid Other | Admitting: Physical Therapy

## 2023-05-12 ENCOUNTER — Ambulatory Visit: Payer: Medicaid Other | Admitting: Sports Medicine

## 2023-05-12 DIAGNOSIS — Z419 Encounter for procedure for purposes other than remedying health state, unspecified: Secondary | ICD-10-CM | POA: Diagnosis not present

## 2023-06-07 NOTE — Telephone Encounter (Signed)
Lesly Rubenstein could you sign this encounter please thank you

## 2023-06-12 DIAGNOSIS — Z419 Encounter for procedure for purposes other than remedying health state, unspecified: Secondary | ICD-10-CM | POA: Diagnosis not present

## 2023-07-12 DIAGNOSIS — Z419 Encounter for procedure for purposes other than remedying health state, unspecified: Secondary | ICD-10-CM | POA: Diagnosis not present

## 2023-08-07 ENCOUNTER — Other Ambulatory Visit: Payer: Self-pay | Admitting: Physician Assistant

## 2023-08-07 DIAGNOSIS — N401 Enlarged prostate with lower urinary tract symptoms: Secondary | ICD-10-CM

## 2023-08-12 DIAGNOSIS — Z419 Encounter for procedure for purposes other than remedying health state, unspecified: Secondary | ICD-10-CM | POA: Diagnosis not present

## 2023-09-05 ENCOUNTER — Ambulatory Visit: Payer: No Typology Code available for payment source | Admitting: Physician Assistant

## 2023-09-07 ENCOUNTER — Ambulatory Visit (INDEPENDENT_AMBULATORY_CARE_PROVIDER_SITE_OTHER): Payer: Medicaid Other | Admitting: Physician Assistant

## 2023-09-07 ENCOUNTER — Encounter: Payer: Self-pay | Admitting: Physician Assistant

## 2023-09-07 VITALS — BP 115/69 | HR 75 | Temp 98.6°F | Resp 20 | Ht 68.0 in | Wt 175.1 lb

## 2023-09-07 DIAGNOSIS — E1169 Type 2 diabetes mellitus with other specified complication: Secondary | ICD-10-CM

## 2023-09-07 DIAGNOSIS — E785 Hyperlipidemia, unspecified: Secondary | ICD-10-CM

## 2023-09-07 DIAGNOSIS — I10 Essential (primary) hypertension: Secondary | ICD-10-CM | POA: Diagnosis not present

## 2023-09-07 DIAGNOSIS — N401 Enlarged prostate with lower urinary tract symptoms: Secondary | ICD-10-CM | POA: Diagnosis not present

## 2023-09-07 DIAGNOSIS — R351 Nocturia: Secondary | ICD-10-CM

## 2023-09-07 DIAGNOSIS — E1165 Type 2 diabetes mellitus with hyperglycemia: Secondary | ICD-10-CM

## 2023-09-07 DIAGNOSIS — Z7984 Long term (current) use of oral hypoglycemic drugs: Secondary | ICD-10-CM | POA: Diagnosis not present

## 2023-09-07 LAB — POCT GLYCOSYLATED HEMOGLOBIN (HGB A1C): Hemoglobin A1C: 5.6 % (ref 4.0–5.6)

## 2023-09-07 MED ORDER — METOPROLOL SUCCINATE ER 50 MG PO TB24
ORAL_TABLET | ORAL | 1 refills | Status: DC
Start: 2023-09-07 — End: 2024-03-09

## 2023-09-07 MED ORDER — METFORMIN HCL 500 MG PO TABS
ORAL_TABLET | ORAL | 1 refills | Status: DC
Start: 2023-09-07 — End: 2024-03-09

## 2023-09-07 MED ORDER — TADALAFIL 5 MG PO TABS
ORAL_TABLET | ORAL | 1 refills | Status: DC
Start: 2023-09-07 — End: 2024-03-09

## 2023-09-07 MED ORDER — AMLODIPINE BESYLATE 10 MG PO TABS
10.0000 mg | ORAL_TABLET | Freq: Every day | ORAL | 1 refills | Status: DC
Start: 2023-09-07 — End: 2024-03-09

## 2023-09-07 MED ORDER — ATORVASTATIN CALCIUM 80 MG PO TABS: 80.0000 mg | ORAL_TABLET | Freq: Every day | ORAL | 3 refills | Status: AC

## 2023-09-07 NOTE — Progress Notes (Signed)
Established Patient Office Visit  Subjective   Patient ID: Juan Hensley, male    DOB: [birth year]  Age: 52 y.o. MRN: [id number]  Chief Complaint  Patient presents with   Medical Management of Chronic Issues   Diabetes    HPI Pt is a 52 yo male with T2DM, HTN, HLD, BPH who presents to the clinic for follow up and medication refills. He is doing really well. No concerns. He is staying active. He denies any CP, palpitations, headaches or SOB. He is not checking his sugars. He is only on metformin. He is eating healthier and trying to watch carbs and high sugar foods. He does not exercise but is active.   .. Active Ambulatory Problems    Diagnosis Date Noted   Lumbar degenerative disc disease 10/30/2014   Primary osteoarthritis of left elbow 12/20/2014   Essential hypertension, benign 01/09/2015   Tobacco dependence 01/14/2015   Elevated LDL cholesterol level 03/27/2019   Submental mass 03/27/2019   Ganglion cyst 03/27/2019   Diabetes mellitus without complication (HCC) 02/05/2020   Ingrown toenail 06/27/2020   Type 2 diabetes mellitus with hyperglycemia, without long-term current use of insulin (HCC) 11/03/2020   Hyperlipidemia associated with type 2 diabetes mellitus (HCC) 11/03/2020   Benign prostatic hyperplasia with nocturia 03/12/2022   Cervical spondylosis 04/02/2022   Weight loss 12/06/2022   Subacute pansinusitis 02/18/2023   Resolved Ambulatory Problems    Diagnosis Date Noted   Lumbar radiculopathy 10/31/2018   Elevated fasting glucose 03/27/2019   Past Medical History:  Diagnosis Date   Arthritis    Hyperlipidemia    Hypertension    Sinus disease      Review of Systems  All other systems reviewed and are negative.     Objective:     BP 115/69   Pulse 75   Temp 98.6 F (37 C)   Resp 20   Ht 5\' 8"  (1.727 m)   Wt 175 lb 1.9 oz (79.4 kg)   SpO2 96%   BMI 26.63 kg/m  BP Readings from Last 3 Encounters:  09/07/23 115/69  02/18/23 111/68  12/06/22  137/88   Wt Readings from Last 3 Encounters:  09/07/23 175 lb 1.9 oz (79.4 kg)  05/10/23 172 lb (78 kg)  12/06/22 186 lb (84.4 kg)    .Marland Kitchen Results for orders placed or performed in visit on 09/07/23  POCT HgB A1C  Result Value Ref Range   Hemoglobin A1C 5.6 4.0 - 5.6 %   HbA1c POC (<> result, manual entry)     HbA1c, POC (prediabetic range)     HbA1c, POC (controlled diabetic range)       Physical Exam Constitutional:      Appearance: Normal appearance.  HENT:     Head: Normocephalic.  Cardiovascular:     Rate and Rhythm: Normal rate and regular rhythm.     Pulses: Normal pulses.  Pulmonary:     Effort: Pulmonary effort is normal.  Musculoskeletal:     Right lower leg: No edema.     Left lower leg: No edema.  Neurological:     General: No focal deficit present.     Mental Status: He is alert and oriented to person, place, and time.  Psychiatric:        Mood and Affect: Mood normal.           Assessment & Plan:  .[name] was seen today for medical management of chronic issues and diabetes.  Diagnoses and all  orders for this visit:  Type 2 diabetes mellitus with hyperglycemia, without long-term current use of insulin (HCC) -     POCT HgB A1C -     metFORMIN (GLUCOPHAGE) 500 MG tablet; Take 1 tab (500 mg) with breakfast and 2 tabs (1000 mg) with dinner daily for blood sugar. -     CMP14+EGFR  Essential hypertension, benign -     metoprolol succinate (TOPROL-XL) 50 MG 24 hr tablet; Take 1 tablet by mouth once daily. -     amLODipine (NORVASC) 10 MG tablet; Take 1 tablet (10 mg total) by mouth daily. -     CMP14+EGFR -     CBC w/Diff/Platelet  Hyperlipidemia associated with type 2 diabetes mellitus (HCC) -     atorvastatin (LIPITOR) 80 MG tablet; Take 1 tablet (80 mg total) by mouth daily. -     Lipid panel  Benign prostatic hyperplasia with nocturia -     tadalafil (CIALIS) 5 MG tablet; Take 1 tablet by mouth once daily -     PSA -     CBC  w/Diff/Platelet   A1C to goal Continue on metformin BP to goal.  On statin, needs labs-ordered today Needs eye exam Declines flu/shingles/covid vaccine today PSA for management   Return in about 6 months (around 03/06/2024).    Tandy Gaw, PA-C

## 2023-09-11 DIAGNOSIS — Z419 Encounter for procedure for purposes other than remedying health state, unspecified: Secondary | ICD-10-CM | POA: Diagnosis not present

## 2023-09-12 ENCOUNTER — Encounter: Payer: Self-pay | Admitting: Physician Assistant

## 2023-10-12 DIAGNOSIS — Z419 Encounter for procedure for purposes other than remedying health state, unspecified: Secondary | ICD-10-CM | POA: Diagnosis not present

## 2023-11-12 DIAGNOSIS — Z419 Encounter for procedure for purposes other than remedying health state, unspecified: Secondary | ICD-10-CM | POA: Diagnosis not present

## 2023-12-10 DIAGNOSIS — Z419 Encounter for procedure for purposes other than remedying health state, unspecified: Secondary | ICD-10-CM | POA: Diagnosis not present

## 2024-01-21 DIAGNOSIS — Z419 Encounter for procedure for purposes other than remedying health state, unspecified: Secondary | ICD-10-CM | POA: Diagnosis not present

## 2024-02-20 DIAGNOSIS — Z419 Encounter for procedure for purposes other than remedying health state, unspecified: Secondary | ICD-10-CM | POA: Diagnosis not present

## 2024-03-06 ENCOUNTER — Ambulatory Visit: Payer: Medicaid Other | Admitting: Physician Assistant

## 2024-03-09 ENCOUNTER — Ambulatory Visit (INDEPENDENT_AMBULATORY_CARE_PROVIDER_SITE_OTHER): Admitting: Physician Assistant

## 2024-03-09 ENCOUNTER — Encounter: Payer: Self-pay | Admitting: Physician Assistant

## 2024-03-09 VITALS — BP 138/80 | HR 70 | Temp 99.0°F | Ht 68.0 in | Wt 181.0 lb

## 2024-03-09 DIAGNOSIS — E1169 Type 2 diabetes mellitus with other specified complication: Secondary | ICD-10-CM

## 2024-03-09 DIAGNOSIS — I10 Essential (primary) hypertension: Secondary | ICD-10-CM

## 2024-03-09 DIAGNOSIS — R809 Proteinuria, unspecified: Secondary | ICD-10-CM

## 2024-03-09 DIAGNOSIS — E785 Hyperlipidemia, unspecified: Secondary | ICD-10-CM | POA: Diagnosis not present

## 2024-03-09 DIAGNOSIS — N401 Enlarged prostate with lower urinary tract symptoms: Secondary | ICD-10-CM

## 2024-03-09 DIAGNOSIS — R351 Nocturia: Secondary | ICD-10-CM

## 2024-03-09 DIAGNOSIS — K4090 Unilateral inguinal hernia, without obstruction or gangrene, not specified as recurrent: Secondary | ICD-10-CM | POA: Diagnosis not present

## 2024-03-09 DIAGNOSIS — E1165 Type 2 diabetes mellitus with hyperglycemia: Secondary | ICD-10-CM

## 2024-03-09 DIAGNOSIS — Z7984 Long term (current) use of oral hypoglycemic drugs: Secondary | ICD-10-CM

## 2024-03-09 LAB — POCT GLYCOSYLATED HEMOGLOBIN (HGB A1C): Hemoglobin A1C: 5.5 % (ref 4.0–5.6)

## 2024-03-09 LAB — POCT UA - MICROALBUMIN
Creatinine, POC: 30 mg/dL
Microalbumin Ur, POC: 10 mg/L

## 2024-03-09 MED ORDER — AMLODIPINE BESYLATE 10 MG PO TABS
10.0000 mg | ORAL_TABLET | Freq: Every day | ORAL | 1 refills | Status: AC
Start: 2024-03-09 — End: ?

## 2024-03-09 MED ORDER — METFORMIN HCL 500 MG PO TABS: ORAL_TABLET | ORAL | 1 refills | Status: AC

## 2024-03-09 MED ORDER — METOPROLOL SUCCINATE ER 50 MG PO TB24: ORAL_TABLET | ORAL | 1 refills | Status: AC

## 2024-03-09 MED ORDER — TADALAFIL 5 MG PO TABS: ORAL_TABLET | ORAL | 1 refills | Status: AC

## 2024-03-09 MED ORDER — DAPAGLIFLOZIN PROPANEDIOL 10 MG PO TABS
10.0000 mg | ORAL_TABLET | Freq: Every day | ORAL | 1 refills | Status: AC
Start: 2024-03-09 — End: ?

## 2024-03-09 NOTE — Progress Notes (Signed)
 Established Patient Office Visit  Subjective   Patient ID: Juan Hensley, male    DOB: [birth year]  Age: 53 y.o. MRN: [id number]  No chief complaint on file.   HPI Pt is a 53 yo male with T2DM, HTN, HLD who presents to the clinic for 6 month follow up.   Pt is not checking sugars. He is taking meformin daily without problems. He is active. He denies any hypoglycemic events, CP, palpitations, headaches. He is keeping to a diabetic diet.   He noticed a bulge in left lower abdomen that keeps getting bigger. No pain. No injury.   .. Active Ambulatory Problems    Diagnosis Date Noted   Lumbar degenerative disc disease 10/30/2014   Primary osteoarthritis of left elbow 12/20/2014   Essential hypertension, benign 01/09/2015   Tobacco dependence 01/14/2015   Elevated LDL cholesterol level 03/27/2019   Submental mass 03/27/2019   Ganglion cyst 03/27/2019   Diabetes mellitus without complication (HCC) 02/05/2020   Ingrown toenail 06/27/2020   Type 2 diabetes mellitus with hyperglycemia, without long-term current use of insulin (HCC) 11/03/2020   Hyperlipidemia associated with type 2 diabetes mellitus (HCC) 11/03/2020   Benign prostatic hyperplasia with nocturia 03/12/2022   Cervical spondylosis 04/02/2022   Weight loss 12/06/2022   Subacute pansinusitis 02/18/2023   Microalbuminuria 03/09/2024   Left inguinal hernia 03/09/2024   Resolved Ambulatory Problems    Diagnosis Date Noted   Lumbar radiculopathy 10/31/2018   Elevated fasting glucose 03/27/2019   Past Medical History:  Diagnosis Date   Arthritis    Hyperlipidemia    Hypertension    Sinus disease        ROS See HPI.    Objective:     BP 138/80   Pulse 70   Temp 99 F (37.2 C)   Ht 5\' 8"  (1.727 m)   Wt 181 lb (82.1 kg)   SpO2 99%   BMI 27.52 kg/m  BP Readings from Last 3 Encounters:  03/09/24 138/80  09/07/23 115/69  02/18/23 111/68   Wt Readings from Last 3 Encounters:  03/09/24 181 lb (82.1 kg)   09/07/23 175 lb 1.9 oz (79.4 kg)  05/10/23 172 lb (78 kg)   .Aaron Aas Lab Results  Component Value Date   HGBA1C 5.5 03/09/2024       Physical Exam Constitutional:      Appearance: Normal appearance.  HENT:     Head: Normocephalic.  Cardiovascular:     Rate and Rhythm: Normal rate and regular rhythm.  Pulmonary:     Effort: Pulmonary effort is normal.     Breath sounds: Normal breath sounds.  Abdominal:     General: Bowel sounds are normal. There is no distension.     Palpations: Abdomen is soft. There is no mass.     Tenderness: There is no abdominal tenderness. There is no right CVA tenderness, left CVA tenderness, guarding or rebound.     Hernia: A hernia is present.     Comments: Left inguinal hernia, non tender bulge  Musculoskeletal:     Right lower leg: No edema.     Left lower leg: No edema.  Neurological:     General: No focal deficit present.     Mental Status: He is alert and oriented to person, place, and time.  Psychiatric:        Mood and Affect: Mood normal.          Assessment & Plan:  Aaron AasAaron AasDiagnoses and all orders for this  visit:  Type 2 diabetes mellitus with hyperglycemia, without long-term current use of insulin (HCC) -     POCT UA - Microalbumin -     POCT HgB A1C -     PSA -     CMP14+EGFR -     Lipid panel -     CBC w/Diff/Platelet -     metFORMIN  (GLUCOPHAGE ) 500 MG tablet; Take 1 tab (500 mg) with breakfast and 2 tabs (1000 mg) with dinner daily for blood sugar. -     dapagliflozin  propanediol (FARXIGA ) 10 MG TABS tablet; Take 1 tablet (10 mg total) by mouth daily.  Essential hypertension, benign -     PSA -     CMP14+EGFR -     Lipid panel -     CBC w/Diff/Platelet -     amLODipine  (NORVASC ) 10 MG tablet; Take 1 tablet (10 mg total) by mouth daily. -     metoprolol  succinate (TOPROL -XL) 50 MG 24 hr tablet; Take 1 tablet by mouth once daily.  Hyperlipidemia associated with type 2 diabetes mellitus (HCC) -     PSA -     CMP14+EGFR -      Lipid panel -     CBC w/Diff/Platelet  Benign prostatic hyperplasia with nocturia -     tadalafil  (CIALIS ) 5 MG tablet; Take 1 tablet by mouth once daily  Left inguinal hernia -     Ambulatory referral to General Surgery  Microalbuminuria -     dapagliflozin  propanediol (FARXIGA ) 10 MG TABS tablet; Take 1 tablet (10 mg total) by mouth daily.   A1C to goal  Continue on metformin  BP elevated and not to goal Taking on half norvasc  right now, continue this and start farxiga  For kidney protection Microalbumin abnormal today On statin, lipid to be rechecked today Needs eye exam Foot exam UTD Declined shingles and pneumonia vaccine Follow up in 3 months for BP check  Left inguinal hernia-referral to general surgery HO given  ED precautions discussed   Sandy Crumb, PA-C

## 2024-03-09 NOTE — Patient Instructions (Addendum)
 Start farxiga for kidney protection.   Hernia, Adult     A hernia happens when an organ or tissue inside your body pushes out through a weak spot in the muscles of your belly. This makes a bulge. The bulge may be: In a scar from surgery. This type of bulge is called an incisional hernia. Near your belly button. This type is called an umbilical hernia. In your groin, which is the area where your leg meets your lower belly. This type is called an inguinal hernia. If you're male, this type could also be in your scrotum. In your upper thigh. This type is called a femoral hernia. Inside your belly. This type is called a hiatal hernia. It happens when your stomach slides above your diaphragm, which is the muscle between your belly and your chest. What are the causes? You may get a hernia if: You lift heavy things. You cough over a long period of time. You have trouble pooping, also called constipation. Trouble pooping can lead to straining. There's a cut from surgery in your belly. You have a problem that's present at birth. There's fluid around your belly. You're male and have a testicle that hasn't moved down into your scrotum. You may be at greater risk for hernia if: You smoke. You're very overweight. What are the signs or symptoms? The main symptom is a bulge, but the bulge may not always be seen. It may grow bigger or be easier to see when you cough or strain, such as when you lift something heavy. If you can push the bulge back into your belly, it may not cause pain. If you can't push it back into your belly, it may lose its blood supply. This can cause: Pain. Fever. Nausea and vomiting. Swelling. Trouble pooping. How is this diagnosed? A hernia may be diagnosed based on your symptoms, medical history, and an exam. Your health care provider may ask you to cough or move in a way that makes the bulge easier to see. You may also have tests done. These may  include: X-rays. Ultrasound. CT scan. How is this treated? A hernia that's small and painless may not need to be treated. A hernia that's large or painful may be treated with surgery. Surgery involves pushing the bulge back into place and fixing the weak area of the muscle or belly. Follow these instructions at home: Activity Try not to strain. You may have to avoid lifting. Ask how much weight you can safely lift. If you lift something heavy, use your leg muscles. Do not use your back muscles to lift. Prevent trouble pooping You may need to take these actions to prevent or treat trouble pooping: Drink enough fluid to keep your pee (urine) pale yellow. Take medicines to help you poop. Eat foods high in fiber. These include beans, whole grains, and fresh fruits and vegetables. General instructions When you cough, try to cough gently. Try to push the bulge back in by very gently pressing on it when you're lying down. Do not try to force it back in if it won't push in easily. If you're overweight, work with your provider to lose weight safely. Do not smoke, vape, or use products with nicotine or tobacco in them. If you need help quitting, talk with your provider. If you're going to have surgery, watch your hernia for changes in shape, size, or color. Tell your provider about any changes. Contact a health care provider if: You get new pain, swelling, or redness  near your hernia. You have trouble pooping. Your poop is harder or larger than normal. You have a fever or chills. You have nausea or vomiting. Your hernia can't be pushed in. Get help right away if: You have belly pain that gets worse. Your hernia: Changes in shape or size. Changes color. Feels hard or hurts when you touch it. These symptoms may be an emergency. Call 911 right away. Do not wait to see if the symptoms will go away. Do not drive yourself to the hospital. This information is not intended to replace advice  given to you by your health care provider. Make sure you discuss any questions you have with your health care provider. Document Revised: 06/30/2023 Document Reviewed: 12/21/2022 Elsevier Patient Education  2024 [employer].

## 2024-03-10 LAB — LIPID PANEL
Chol/HDL Ratio: 3.6 ratio (ref 0.0–5.0)
Cholesterol, Total: 146 mg/dL (ref 100–199)
HDL: 41 mg/dL (ref >39)
LDL Chol Calc (NIH): 94 mg/dL (ref 0–99)
Triglycerides: 50 mg/dL (ref 0–149)
VLDL Cholesterol Cal: 11 mg/dL (ref 5–40)

## 2024-03-10 LAB — CBC WITH DIFFERENTIAL/PLATELET
Basophils Absolute: 0 10*3/uL (ref 0.0–0.2)
Basos: 1 %
EOS (ABSOLUTE): 0.1 10*3/uL (ref 0.0–0.4)
Eos: 3 %
Hematocrit: 45.7 % (ref 37.5–51.0)
Hemoglobin: 15 g/dL (ref 13.0–17.7)
Immature Grans (Abs): 0 10*3/uL (ref 0.0–0.1)
Immature Granulocytes: 0 %
Lymphocytes Absolute: 2.3 10*3/uL (ref 0.7–3.1)
Lymphs: 49 %
MCH: 28.9 pg (ref 26.6–33.0)
MCHC: 32.8 g/dL (ref 31.5–35.7)
MCV: 88 fL (ref 79–97)
Monocytes Absolute: 0.3 10*3/uL (ref 0.1–0.9)
Monocytes: 7 %
Neutrophils Absolute: 1.8 10*3/uL (ref 1.4–7.0)
Neutrophils: 40 %
Platelets: 228 10*3/uL (ref 150–450)
RBC: 5.19 x10E6/uL (ref 4.14–5.80)
RDW: 13.2 % (ref 11.6–15.4)
WBC: 4.6 10*3/uL (ref 3.4–10.8)

## 2024-03-10 LAB — CMP14+EGFR
ALT: 16 IU/L (ref 0–44)
AST: 20 IU/L (ref 0–40)
Albumin: 4.5 g/dL (ref 3.8–4.9)
Alkaline Phosphatase: 85 IU/L (ref 44–121)
BUN/Creatinine Ratio: 11 (ref 9–20)
BUN: 11 mg/dL (ref 6–24)
Bilirubin Total: 0.5 mg/dL (ref 0.0–1.2)
CO2: 20 mmol/L (ref 20–29)
Calcium: 9.3 mg/dL (ref 8.7–10.2)
Chloride: 107 mmol/L — ABNORMAL HIGH (ref 96–106)
Creatinine, Ser: 0.97 mg/dL (ref 0.76–1.27)
Globulin, Total: 1.9 g/dL (ref 1.5–4.5)
Glucose: 75 mg/dL (ref 70–99)
Potassium: 4.1 mmol/L (ref 3.5–5.2)
Sodium: 142 mmol/L (ref 134–144)
Total Protein: 6.4 g/dL (ref 6.0–8.5)
eGFR: 93 mL/min/{1.73_m2} (ref >59)

## 2024-03-10 LAB — PSA: Prostate Specific Ag, Serum: 0.7 ng/mL (ref 0.0–4.0)

## 2024-03-12 ENCOUNTER — Encounter: Payer: Self-pay | Admitting: Physician Assistant

## 2024-03-12 ENCOUNTER — Ambulatory Visit: Payer: Self-pay | Admitting: Physician Assistant

## 2024-03-12 MED ORDER — EZETIMIBE 10 MG PO TABS: 10.0000 mg | ORAL_TABLET | Freq: Every day | ORAL | 3 refills | Status: AC

## 2024-03-12 NOTE — Progress Notes (Signed)
 Juan Hensley,   Kidney, liver, glucose look good.   PSA low and to goal.   Goal LDL, bad cholesterol, is under 70. You are not quite there. Are you taking lipitor? If you are taking regularly are you ok with me adding zetia to see if we can get you to goal?

## 2024-03-15 ENCOUNTER — Other Ambulatory Visit (HOSPITAL_COMMUNITY): Payer: Self-pay

## 2024-03-15 ENCOUNTER — Telehealth: Payer: Self-pay

## 2024-03-15 NOTE — Telephone Encounter (Signed)
 Pharmacy Patient Advocate Encounter  Received notification from [facility] that Prior Authorization for Farxiga  10mg  tabs has been APPROVED from 03/01/24 to 03/15/25. Ran test claim, Copay is $4.00. This test claim was processed through [facility]- copay amounts may vary at other pharmacies due to pharmacy/plan contracts, or as the patient moves through the different stages of their insurance plan.   PA #/Case ID/Reference #: [id number]  Left a message at [facility] to notify of the approval.

## 2024-03-15 NOTE — Telephone Encounter (Signed)
 Pharmacy Patient Advocate Encounter   Received notification from Patient Pharmacy that prior authorization for Farxiga  10mg  tab is required/requested.   Insurance verification completed.   The patient is insured through [facility] [place] .   Per test claim: PA required; PA submitted to above mentioned insurance via CoverMyMeds Key/confirmation #/EOC [id number] Status is pending

## 2024-03-22 DIAGNOSIS — Z419 Encounter for procedure for purposes other than remedying health state, unspecified: Secondary | ICD-10-CM | POA: Diagnosis not present

## 2024-06-06 ENCOUNTER — Telehealth: Payer: Self-pay

## 2024-06-06 NOTE — Telephone Encounter (Signed)
 I spoke with patient and he has not had a diabetic eye exam in the last few years. I did ask if he would try to schedule an eye exam.

## 2024-06-12 ENCOUNTER — Encounter: Payer: Self-pay | Admitting: Sports Medicine

## 2024-08-29 ENCOUNTER — Ambulatory Visit: Admitting: Physician Assistant

## 2024-09-04 ENCOUNTER — Ambulatory Visit: Admitting: Physician Assistant

## 2024-09-17 ENCOUNTER — Ambulatory Visit: Payer: Self-pay | Admitting: Physician Assistant
